# Patient Record
Sex: Female | Born: 1980 | Race: White | Hispanic: No | Marital: Married | Smoking: Never smoker
Health system: Southern US, Community
[De-identification: ages and names within clinical notes are randomized; demographics above are authoritative.]

## PROBLEM LIST (undated history)

## (undated) DIAGNOSIS — K900 Celiac disease: Secondary | ICD-10-CM

## (undated) DIAGNOSIS — R51 Headache: Secondary | ICD-10-CM

## (undated) DIAGNOSIS — Z8619 Personal history of other infectious and parasitic diseases: Secondary | ICD-10-CM

## (undated) DIAGNOSIS — J45909 Unspecified asthma, uncomplicated: Secondary | ICD-10-CM

## (undated) DIAGNOSIS — E039 Hypothyroidism, unspecified: Secondary | ICD-10-CM

## (undated) DIAGNOSIS — R519 Headache, unspecified: Secondary | ICD-10-CM

## (undated) DIAGNOSIS — F329 Major depressive disorder, single episode, unspecified: Secondary | ICD-10-CM

## (undated) DIAGNOSIS — F32A Depression, unspecified: Secondary | ICD-10-CM

## (undated) HISTORY — PX: TONSILLECTOMY: SUR1361

---

## 2014-12-25 LAB — OB RESULTS CONSOLE ABO/RH: RH Type: NEGATIVE

## 2014-12-25 LAB — OB RESULTS CONSOLE ANTIBODY SCREEN: ANTIBODY SCREEN: POSITIVE

## 2014-12-25 LAB — OB RESULTS CONSOLE RPR: RPR: NONREACTIVE

## 2014-12-25 LAB — OB RESULTS CONSOLE HIV ANTIBODY (ROUTINE TESTING): HIV: NONREACTIVE

## 2014-12-25 LAB — OB RESULTS CONSOLE RUBELLA ANTIBODY, IGM: RUBELLA: IMMUNE

## 2014-12-25 LAB — OB RESULTS CONSOLE HEPATITIS B SURFACE ANTIGEN: HEP B S AG: NEGATIVE

## 2015-07-05 ENCOUNTER — Encounter (HOSPITAL_COMMUNITY): Payer: Self-pay

## 2015-07-06 ENCOUNTER — Encounter (HOSPITAL_COMMUNITY): Payer: Self-pay

## 2015-07-06 ENCOUNTER — Telehealth (HOSPITAL_COMMUNITY): Payer: Self-pay | Admitting: *Deleted

## 2015-07-06 NOTE — Telephone Encounter (Signed)
Preadmission screen  

## 2015-07-06 NOTE — Patient Instructions (Signed)
20 Carolyn Knight  07/06/2015   Your procedure is scheduled on:  07/16/2015  Enter through the Main Entrance of [facility] at 1130 AM.  Pick up the phone at the desk and dial [phone].   Call this number if you have problems the morning of surgery: [phone]   Remember:   Do not eat food:After Midnight.  Do not drink clear liquids: 4 Hours before arrival.  Take these medicines the morning of surgery with A SIP OF WATER: synthroid   Do not wear jewelry, make-up or nail polish.  Do not wear lotions, powders, or perfumes. You may wear deodorant.  Do not shave 48 hours prior to surgery.  Do not bring valuables to the hospital.  [facility] is not   responsible for any belongings or valuables brought to the hospital.  Contacts, dentures or bridgework may not be worn into surgery.  Leave suitcase in the car. After surgery it may be brought to your room.  For patients admitted to the hospital, checkout time is 11:00 AM the day of              discharge.   Patients discharged the day of surgery will not be allowed to drive             home.  Name and phone number of your driver: na  Special Instructions:   Shower using CHG 2 nights before surgery and the night before surgery.  If you shower the day of surgery use CHG.  Use special wash - you have one bottle of CHG for all showers.  You should use approximately 1/3 of the bottle for each shower.   Please read over the following fact sheets that you were given:   Surgical Site Infection Prevention

## 2015-07-10 NOTE — H&P (Signed)
Carolyn SilversmithWendy Knight  DICTATION # [id number] CSN# [id number]   Carolyn PicaHOLLAND,Carolyn Knight M, MD 07/10/2015 12:00 PM

## 2015-07-11 ENCOUNTER — Inpatient Hospital Stay (HOSPITAL_COMMUNITY): Admission: RE | Admit: 2015-07-11 | Payer: BC Managed Care – PPO | Source: Ambulatory Visit

## 2015-07-11 NOTE — H&P (Signed)
NAMThea Knight:  Hottle, Kris              ACCOUNT NO.:  [id number]  MEDICAL RECORD NO.:  [id number]  LOCATION:                                 FACILITY:  PHYSICIAN:  Duke Salviaichard M. Marcelle OverlieHolland, M.D.DATE OF BIRTH:  Apr 25, 1980  DATE OF ADMISSION:  07/16/2015 DATE OF DISCHARGE:                             HISTORY & PHYSICAL   CHIEF COMPLAINT:  Breech presentation at term, fetal macrosomia, EFW is in 98th percentile for primary cesarean section.  HISTORY OF PRESENT ILLNESS:  A 35 year old, G3, P2-0-0-2, EDD July 23, 2015, presents at term for primary CS, declines ECV due to the EFW. This procedure including specific risks related to bleeding, infection, transfusion, wound infection, phlebitis, adjacent organ injury.  All reviewed with her which she understands and accepts.  GBS is negative. She is Rh negative.  Her thyroid profile has been normal during pregnancy, being checked because she has a history of being hypothyroid. One-hour GTT was normal at 116.  REVIEW OF SYSTEMS:  Significant otherwise for normal panorama screen and a history of celiac disease, 2 prior vaginal deliveries, [place].  For the remainder of her past medical history, please see the [facility] form.  PHYSICAL EXAMINATION:  VITAL SIGNS:  Temp 98.2, blood pressure 120/70. HEENT:  Unremarkable. NECK:  Supple without masses. LUNGS:  Clear. CARDIOVASCULAR:  Regular rate and rhythm without murmurs, rubs, or gallops. BREASTS:  Without masses. GU:  Term fundal height.  Fetal heart rate 140.  Cervix was closed, breech by Leopold's.  IMPRESSION:  Breech presentation at term, fetal macrosomia.  PLAN:  Primary cesarean section.  Procedure and risks discussed as above.     Gregrey Bloyd M. Marcelle OverlieHolland, M.D.   ______________________________ Duke Salviaichard M. Marcelle OverlieHolland, M.D.   RMH/MEDQ  D:  07/10/2015  T:  07/11/2015  Job:  [id number]

## 2015-07-13 ENCOUNTER — Encounter (HOSPITAL_COMMUNITY)
Admission: RE | Admit: 2015-07-13 | Discharge: 2015-07-13 | Disposition: A | Discharge status: Home / Self Care | Payer: BC Managed Care – PPO | Source: Ambulatory Visit | Attending: Obstetrics and Gynecology | Admitting: Obstetrics and Gynecology

## 2015-07-13 HISTORY — DX: Depression, unspecified: F32.A

## 2015-07-13 HISTORY — DX: Celiac disease: K90.0

## 2015-07-13 HISTORY — DX: Unspecified asthma, uncomplicated: J45.909

## 2015-07-13 HISTORY — DX: Hypothyroidism, unspecified: E03.9

## 2015-07-13 HISTORY — DX: Headache: R51

## 2015-07-13 HISTORY — DX: Headache, unspecified: R51.9

## 2015-07-13 HISTORY — DX: Major depressive disorder, single episode, unspecified: F32.9

## 2015-07-13 HISTORY — DX: Personal history of other infectious and parasitic diseases: Z86.19

## 2015-07-13 LAB — CBC
HEMATOCRIT: 36.9 % (ref 36.0–46.0)
HEMOGLOBIN: 12.3 g/dL (ref 12.0–15.0)
MCH: 29.9 pg (ref 26.0–34.0)
MCHC: 33.3 g/dL (ref 30.0–36.0)
MCV: 89.8 fL (ref 78.0–100.0)
Platelets: 334 10*3/uL (ref 150–400)
RBC: 4.11 MIL/uL (ref 3.87–5.11)
RDW: 14.5 % (ref 11.5–15.5)
WBC: 16.2 10*3/uL — ABNORMAL HIGH (ref 4.0–10.5)

## 2015-07-14 LAB — TYPE AND SCREEN
ABO/RH(D): O NEG
ANTIBODY SCREEN: POSITIVE
DAT, IGG: NEGATIVE
UNIT DIVISION: 0
UNIT DIVISION: 0

## 2015-07-14 LAB — RPR: RPR Ser Ql: NONREACTIVE

## 2015-07-15 MED ORDER — DEXTROSE 5 % IV SOLN
2.0000 g | INTRAVENOUS | Status: AC
  Administered 2015-07-16: 2 g via INTRAVENOUS
  Filled 2015-07-15: qty 2

## 2015-07-16 ENCOUNTER — Encounter (HOSPITAL_COMMUNITY)
Admission: AD | Disposition: A | Discharge status: Home / Self Care | Payer: Self-pay | Source: Ambulatory Visit | Attending: Obstetrics & Gynecology

## 2015-07-16 ENCOUNTER — Inpatient Hospital Stay (HOSPITAL_COMMUNITY): Payer: BC Managed Care – PPO | Admitting: Anesthesiology

## 2015-07-16 ENCOUNTER — Inpatient Hospital Stay (HOSPITAL_COMMUNITY)
Admission: AD | Admit: 2015-07-16 | Discharge: 2015-07-20 | DRG: 765 | Disposition: A | Discharge status: Home / Self Care | Payer: BC Managed Care – PPO | Source: Ambulatory Visit | Attending: Obstetrics & Gynecology | Admitting: Obstetrics & Gynecology

## 2015-07-16 ENCOUNTER — Encounter (HOSPITAL_COMMUNITY): Payer: Self-pay | Admitting: *Deleted

## 2015-07-16 DIAGNOSIS — Z3A39 39 weeks gestation of pregnancy: Secondary | ICD-10-CM

## 2015-07-16 DIAGNOSIS — O26893 Other specified pregnancy related conditions, third trimester: Secondary | ICD-10-CM | POA: Diagnosis present

## 2015-07-16 DIAGNOSIS — O3663X0 Maternal care for excessive fetal growth, third trimester, not applicable or unspecified: Secondary | ICD-10-CM | POA: Diagnosis present

## 2015-07-16 DIAGNOSIS — D649 Anemia, unspecified: Secondary | ICD-10-CM | POA: Diagnosis present

## 2015-07-16 DIAGNOSIS — Z6791 Unspecified blood type, Rh negative: Secondary | ICD-10-CM

## 2015-07-16 DIAGNOSIS — O9081 Anemia of the puerperium: Secondary | ICD-10-CM | POA: Diagnosis present

## 2015-07-16 DIAGNOSIS — Z98891 History of uterine scar from previous surgery: Secondary | ICD-10-CM

## 2015-07-16 DIAGNOSIS — O321XX0 Maternal care for breech presentation, not applicable or unspecified: Secondary | ICD-10-CM | POA: Diagnosis present

## 2015-07-16 DIAGNOSIS — D62 Acute posthemorrhagic anemia: Secondary | ICD-10-CM

## 2015-07-16 LAB — CBC
HCT: 26.4 % — ABNORMAL LOW (ref 36.0–46.0)
HEMOGLOBIN: 8.8 g/dL — AB (ref 12.0–15.0)
MCH: 30 pg (ref 26.0–34.0)
MCHC: 33.3 g/dL (ref 30.0–36.0)
MCV: 90.1 fL (ref 78.0–100.0)
Platelets: 307 10*3/uL (ref 150–400)
RBC: 2.93 MIL/uL — AB (ref 3.87–5.11)
RDW: 14.5 % (ref 11.5–15.5)
WBC: 24.9 10*3/uL — ABNORMAL HIGH (ref 4.0–10.5)

## 2015-07-16 LAB — BIRTH TISSUE RECOVERY COLLECTION (PLACENTA DONATION)

## 2015-07-16 SURGERY — Surgical Case
Anesthesia: Spinal

## 2015-07-16 MED ORDER — NALBUPHINE HCL 10 MG/ML IJ SOLN: 5.0000 mg | Freq: Once | INTRAMUSCULAR | Status: DC | PRN

## 2015-07-16 MED ORDER — MORPHINE SULFATE (PF) 0.5 MG/ML IJ SOLN
INTRAMUSCULAR | Status: AC
  Filled 2015-07-16: qty 10

## 2015-07-16 MED ORDER — PHENYLEPHRINE HCL 10 MG/ML IJ SOLN
INTRAMUSCULAR | Status: DC | PRN
  Administered 2015-07-16: 80 ug via INTRAVENOUS

## 2015-07-16 MED ORDER — LEVOTHYROXINE SODIUM 100 MCG PO TABS
100.0000 ug | ORAL_TABLET | Freq: Every day | ORAL | Status: DC
  Administered 2015-07-17 – 2015-07-20 (×4): 100 ug via ORAL
  Filled 2015-07-16 (×6): qty 1

## 2015-07-16 MED ORDER — COCONUT OIL OIL: 1.0000 "application " | TOPICAL_OIL | Status: DC | PRN

## 2015-07-16 MED ORDER — EPHEDRINE SULFATE 50 MG/ML IJ SOLN
INTRAMUSCULAR | Status: DC | PRN
  Administered 2015-07-16: 10 mg via INTRAVENOUS

## 2015-07-16 MED ORDER — DIBUCAINE 1 % RE OINT: 1.0000 "application " | TOPICAL_OINTMENT | RECTAL | Status: DC | PRN

## 2015-07-16 MED ORDER — KETOROLAC TROMETHAMINE 30 MG/ML IJ SOLN: 30.0000 mg | Freq: Four times a day (QID) | INTRAMUSCULAR | Status: DC | PRN

## 2015-07-16 MED ORDER — SODIUM CHLORIDE 0.9 % IV SOLN: 250.0000 mL | INTRAVENOUS | Status: DC

## 2015-07-16 MED ORDER — BISACODYL 10 MG RE SUPP: 10.0000 mg | Freq: Every day | RECTAL | Status: DC | PRN

## 2015-07-16 MED ORDER — ONDANSETRON HCL 4 MG/2ML IJ SOLN: 4.0000 mg | Freq: Three times a day (TID) | INTRAMUSCULAR | Status: DC | PRN

## 2015-07-16 MED ORDER — MENTHOL 3 MG MT LOZG: 1.0000 | LOZENGE | OROMUCOSAL | Status: DC | PRN

## 2015-07-16 MED ORDER — OXYTOCIN 10 UNIT/ML IJ SOLN
INTRAMUSCULAR | Status: AC
  Filled 2015-07-16: qty 4

## 2015-07-16 MED ORDER — DIPHENHYDRAMINE HCL 25 MG PO CAPS: 25.0000 mg | ORAL_CAPSULE | Freq: Four times a day (QID) | ORAL | Status: DC | PRN

## 2015-07-16 MED ORDER — EPHEDRINE 5 MG/ML INJ
INTRAVENOUS | Status: AC
  Filled 2015-07-16: qty 10

## 2015-07-16 MED ORDER — SIMETHICONE 80 MG PO CHEW
80.0000 mg | CHEWABLE_TABLET | ORAL | Status: DC
  Administered 2015-07-17 – 2015-07-19 (×4): 80 mg via ORAL
  Filled 2015-07-16 (×4): qty 1

## 2015-07-16 MED ORDER — OXYCODONE-ACETAMINOPHEN 5-325 MG PO TABS
1.0000 | ORAL_TABLET | ORAL | Status: DC | PRN
  Administered 2015-07-17 (×2): 1 via ORAL
  Filled 2015-07-16 (×2): qty 1

## 2015-07-16 MED ORDER — PHENYLEPHRINE 8 MG IN D5W 100 ML (0.08MG/ML) PREMIX OPTIME
INJECTION | INTRAVENOUS | Status: AC
  Filled 2015-07-16: qty 100

## 2015-07-16 MED ORDER — PRENATAL MULTIVITAMIN CH
1.0000 | ORAL_TABLET | Freq: Every day | ORAL | Status: DC
  Administered 2015-07-17 – 2015-07-20 (×4): 1 via ORAL
  Filled 2015-07-16 (×4): qty 1

## 2015-07-16 MED ORDER — FLEET ENEMA 7-19 GM/118ML RE ENEM: 1.0000 | ENEMA | Freq: Every day | RECTAL | Status: DC | PRN

## 2015-07-16 MED ORDER — KETOROLAC TROMETHAMINE 30 MG/ML IJ SOLN
INTRAMUSCULAR | Status: AC
  Filled 2015-07-16: qty 1

## 2015-07-16 MED ORDER — IBUPROFEN 800 MG PO TABS
800.0000 mg | ORAL_TABLET | Freq: Three times a day (TID) | ORAL | Status: DC | PRN
  Administered 2015-07-17 – 2015-07-20 (×10): 800 mg via ORAL
  Filled 2015-07-16 (×10): qty 1

## 2015-07-16 MED ORDER — SIMETHICONE 80 MG PO CHEW: 80.0000 mg | CHEWABLE_TABLET | ORAL | Status: DC | PRN

## 2015-07-16 MED ORDER — SODIUM CHLORIDE 0.9% FLUSH
3.0000 mL | Freq: Two times a day (BID) | INTRAVENOUS | Status: DC
  Administered 2015-07-19 (×2): 3 mL via INTRAVENOUS

## 2015-07-16 MED ORDER — NALBUPHINE HCL 10 MG/ML IJ SOLN: 5.0000 mg | INTRAMUSCULAR | Status: DC | PRN

## 2015-07-16 MED ORDER — LACTATED RINGERS IV SOLN
INTRAVENOUS | Status: DC
  Administered 2015-07-16 (×2): via INTRAVENOUS

## 2015-07-16 MED ORDER — KETOROLAC TROMETHAMINE 30 MG/ML IJ SOLN
30.0000 mg | Freq: Four times a day (QID) | INTRAMUSCULAR | Status: DC | PRN
  Administered 2015-07-16: 30 mg via INTRAMUSCULAR

## 2015-07-16 MED ORDER — MEASLES, MUMPS & RUBELLA VAC ~~LOC~~ INJ: 0.5000 mL | INJECTION | Freq: Once | SUBCUTANEOUS | Status: DC

## 2015-07-16 MED ORDER — SCOPOLAMINE 1 MG/3DAYS TD PT72: 1.0000 | MEDICATED_PATCH | Freq: Once | TRANSDERMAL | Status: DC

## 2015-07-16 MED ORDER — MEPERIDINE HCL 25 MG/ML IJ SOLN: 6.2500 mg | INTRAMUSCULAR | Status: DC | PRN

## 2015-07-16 MED ORDER — BUPIVACAINE IN DEXTROSE 0.75-8.25 % IT SOLN
INTRATHECAL | Status: DC | PRN
  Administered 2015-07-16: 1.6 mL via INTRATHECAL

## 2015-07-16 MED ORDER — PHENYLEPHRINE 8 MG IN D5W 100 ML (0.08MG/ML) PREMIX OPTIME
INJECTION | INTRAVENOUS | Status: DC | PRN
  Administered 2015-07-16: 60 ug/min via INTRAVENOUS

## 2015-07-16 MED ORDER — SENNOSIDES-DOCUSATE SODIUM 8.6-50 MG PO TABS
2.0000 | ORAL_TABLET | ORAL | Status: DC
  Administered 2015-07-17 – 2015-07-19 (×4): 2 via ORAL
  Filled 2015-07-16 (×4): qty 2

## 2015-07-16 MED ORDER — NALOXONE HCL 0.4 MG/ML IJ SOLN: 0.4000 mg | INTRAMUSCULAR | Status: DC | PRN

## 2015-07-16 MED ORDER — SIMETHICONE 80 MG PO CHEW
80.0000 mg | CHEWABLE_TABLET | Freq: Three times a day (TID) | ORAL | Status: DC
  Administered 2015-07-16 – 2015-07-20 (×10): 80 mg via ORAL
  Filled 2015-07-16 (×11): qty 1

## 2015-07-16 MED ORDER — DIPHENHYDRAMINE HCL 50 MG/ML IJ SOLN: 12.5000 mg | INTRAMUSCULAR | Status: DC | PRN

## 2015-07-16 MED ORDER — LACTATED RINGERS IV SOLN: INTRAVENOUS | Status: AC

## 2015-07-16 MED ORDER — WITCH HAZEL-GLYCERIN EX PADS: 1.0000 "application " | MEDICATED_PAD | CUTANEOUS | Status: DC | PRN

## 2015-07-16 MED ORDER — SODIUM CHLORIDE 0.9% FLUSH: 3.0000 mL | INTRAVENOUS | Status: DC | PRN

## 2015-07-16 MED ORDER — LACTATED RINGERS IV SOLN
INTRAVENOUS | Status: DC | PRN
  Administered 2015-07-16: 13:00:00 via INTRAVENOUS

## 2015-07-16 MED ORDER — SCOPOLAMINE 1 MG/3DAYS TD PT72
MEDICATED_PATCH | TRANSDERMAL | Status: AC
  Administered 2015-07-16: 1.5 mg via TRANSDERMAL
  Filled 2015-07-16: qty 1

## 2015-07-16 MED ORDER — ONDANSETRON HCL 4 MG/2ML IJ SOLN
INTRAMUSCULAR | Status: AC
  Filled 2015-07-16: qty 2

## 2015-07-16 MED ORDER — FENTANYL CITRATE (PF) 100 MCG/2ML IJ SOLN
INTRAMUSCULAR | Status: AC
  Filled 2015-07-16: qty 2

## 2015-07-16 MED ORDER — ONDANSETRON HCL 4 MG/2ML IJ SOLN
INTRAMUSCULAR | Status: DC | PRN
  Administered 2015-07-16: 4 mg via INTRAVENOUS

## 2015-07-16 MED ORDER — ZOLPIDEM TARTRATE 5 MG PO TABS: 5.0000 mg | ORAL_TABLET | Freq: Every evening | ORAL | Status: DC | PRN

## 2015-07-16 MED ORDER — OXYTOCIN 40 UNITS IN LACTATED RINGERS INFUSION - SIMPLE MED: 2.5000 [IU]/h | INTRAVENOUS | Status: AC

## 2015-07-16 MED ORDER — FENTANYL CITRATE (PF) 100 MCG/2ML IJ SOLN: 25.0000 ug | INTRAMUSCULAR | Status: DC | PRN

## 2015-07-16 MED ORDER — LACTATED RINGERS IV BOLUS (SEPSIS)
300.0000 mL | Freq: Once | INTRAVENOUS | Status: AC
  Administered 2015-07-16: 300 mL via INTRAVENOUS

## 2015-07-16 MED ORDER — DIPHENHYDRAMINE HCL 25 MG PO CAPS: 25.0000 mg | ORAL_CAPSULE | ORAL | Status: DC | PRN

## 2015-07-16 MED ORDER — OXYTOCIN 10 UNIT/ML IJ SOLN
40.0000 [IU] | INTRAVENOUS | Status: DC | PRN
  Administered 2015-07-16: 40 [IU] via INTRAVENOUS

## 2015-07-16 MED ORDER — NALOXONE HCL 2 MG/2ML IJ SOSY
1.0000 ug/kg/h | PREFILLED_SYRINGE | INTRAVENOUS | Status: DC | PRN
  Filled 2015-07-16: qty 2

## 2015-07-16 MED ORDER — FENTANYL CITRATE (PF) 100 MCG/2ML IJ SOLN
INTRAMUSCULAR | Status: DC | PRN
  Administered 2015-07-16: 20 ug via INTRATHECAL

## 2015-07-16 MED ORDER — MORPHINE SULFATE (PF) 0.5 MG/ML IJ SOLN
INTRAMUSCULAR | Status: DC | PRN
  Administered 2015-07-16: .2 mg via INTRATHECAL

## 2015-07-16 MED ORDER — SCOPOLAMINE 1 MG/3DAYS TD PT72
1.0000 | MEDICATED_PATCH | Freq: Once | TRANSDERMAL | Status: DC
  Administered 2015-07-16: 1.5 mg via TRANSDERMAL

## 2015-07-16 MED ORDER — ALBUTEROL SULFATE (2.5 MG/3ML) 0.083% IN NEBU: 3.0000 mL | INHALATION_SOLUTION | Freq: Four times a day (QID) | RESPIRATORY_TRACT | Status: DC | PRN

## 2015-07-16 MED ORDER — LACTATED RINGERS IV SOLN
Freq: Once | INTRAVENOUS | Status: AC
  Administered 2015-07-16: 12:00:00 via INTRAVENOUS

## 2015-07-16 MED ORDER — OXYCODONE-ACETAMINOPHEN 5-325 MG PO TABS
2.0000 | ORAL_TABLET | ORAL | Status: DC | PRN
Start: 2015-07-16 — End: 2015-07-20
  Administered 2015-07-17 – 2015-07-20 (×12): 2 via ORAL
  Filled 2015-07-16 (×12): qty 2

## 2015-07-16 MED ORDER — TETANUS-DIPHTH-ACELL PERTUSSIS 5-2.5-18.5 LF-MCG/0.5 IM SUSP: 0.5000 mL | Freq: Once | INTRAMUSCULAR | Status: DC

## 2015-07-16 MED ORDER — ACETAMINOPHEN 325 MG PO TABS
650.0000 mg | ORAL_TABLET | ORAL | Status: DC | PRN
Start: 2015-07-16 — End: 2015-07-20

## 2015-07-16 SURGICAL SUPPLY — 27 items
BENZOIN TINCTURE PRP APPL 2/3 (GAUZE/BANDAGES/DRESSINGS) ×2 IMPLANT
CLAMP CORD UMBIL (MISCELLANEOUS) IMPLANT
CLOSURE STERI STRIP 1/2 X4 (GAUZE/BANDAGES/DRESSINGS) ×2 IMPLANT
CLOTH BEACON ORANGE TIMEOUT ST (SAFETY) ×2 IMPLANT
DRSG OPSITE POSTOP 4X10 (GAUZE/BANDAGES/DRESSINGS) ×2 IMPLANT
DURAPREP 26ML APPLICATOR (WOUND CARE) ×2 IMPLANT
ELECT REM PT RETURN 9FT ADLT (ELECTROSURGICAL) ×2
ELECTRODE REM PT RTRN 9FT ADLT (ELECTROSURGICAL) ×1 IMPLANT
EXTRACTOR VACUUM M CUP 4 TUBE (SUCTIONS) IMPLANT
GLOVE BIO SURGEON STRL SZ7 (GLOVE) ×2 IMPLANT
GLOVE BIOGEL PI IND STRL 7.0 (GLOVE) ×2 IMPLANT
GLOVE BIOGEL PI INDICATOR 7.0 (GLOVE) ×2
GOWN STRL REUS W/TWL LRG LVL3 (GOWN DISPOSABLE) ×4 IMPLANT
KIT ABG SYR 3ML LUER SLIP (SYRINGE) IMPLANT
NEEDLE HYPO 25X5/8 SAFETYGLIDE (NEEDLE) ×2 IMPLANT
NS IRRIG 1000ML POUR BTL (IV SOLUTION) ×2 IMPLANT
PACK C SECTION WH (CUSTOM PROCEDURE TRAY) ×2 IMPLANT
PAD OB MATERNITY 4.3X12.25 (PERSONAL CARE ITEMS) ×2 IMPLANT
PENCIL SMOKE EVAC W/HOLSTER (ELECTROSURGICAL) ×2 IMPLANT
STRIP CLOSURE SKIN 1/2X4 (GAUZE/BANDAGES/DRESSINGS) ×2 IMPLANT
SUT CHROMIC 0 CTX 36 (SUTURE) ×6 IMPLANT
SUT MON AB 4-0 PS1 27 (SUTURE) ×2 IMPLANT
SUT PDS AB 0 CT1 27 (SUTURE) ×4 IMPLANT
SUT VIC AB 3-0 CT1 27 (SUTURE) ×2
SUT VIC AB 3-0 CT1 TAPERPNT 27 (SUTURE) ×2 IMPLANT
TOWEL OR 17X24 6PK STRL BLUE (TOWEL DISPOSABLE) ×2 IMPLANT
TRAY FOLEY CATH SILVER 14FR (SET/KITS/TRAYS/PACK) ×2 IMPLANT

## 2015-07-16 NOTE — Addendum Note (Signed)
Addendum  created 07/16/15 1605 by Elgie CongoNataliya H Ritik Stavola, CRNA   Modules edited: Clinical Notes   Clinical Notes:  File: [id number]

## 2015-07-16 NOTE — Transfer of Care (Signed)
Immediate Anesthesia Transfer of Care Note  Patient: Carolyn Knight  Procedure(s) Performed: Procedure(s) with comments: CESAREAN SECTION (N/A) - Primary edc 07/23/15 NKDA   Patient Location: PACU  Anesthesia Type:Spinal  Level of Consciousness: awake  Airway & Oxygen Therapy: Patient Spontanous Breathing  Post-op Assessment: Report given to RN and Post -op Vital signs reviewed and stable  Post vital signs: stable  Last Vitals:  Filed Vitals:   07/16/15 1142  BP: 136/80  Pulse: 86  Temp: 36.7 C  Resp: 16    Last Pain: There were no vitals filed for this visit.    Patients Stated Pain Goal: 5 (07/16/15 1142)  Complications: No apparent anesthesia complications

## 2015-07-16 NOTE — Anesthesia Procedure Notes (Signed)
Spinal Patient location during procedure: OB Staffing Anesthesiologist: Chery Giusto Preanesthetic Checklist Completed: patient identified, surgical consent, pre-op evaluation, timeout performed, IV checked, risks and benefits discussed and monitors and equipment checked Spinal Block Patient position: sitting Prep: site prepped and draped and DuraPrep Patient monitoring: heart rate, cardiac monitor, continuous pulse ox and blood pressure Approach: midline Location: L3-4 Injection technique: single-shot Needle Needle type: Pencan  Needle gauge: 24 G Needle length: 10 cm Assessment Sensory level: T4   

## 2015-07-16 NOTE — Progress Notes (Signed)
Notified Dr Marcelle Overlieholland of patients urine output, see new orders.

## 2015-07-16 NOTE — Anesthesia Postprocedure Evaluation (Signed)
Anesthesia Post Note  Patient: Carolyn SilversmithWendy Knight  Procedure(s) Performed: Procedure(s) (LRB): CESAREAN SECTION (N/A)  Patient location during evaluation: Mother Baby Anesthesia Type: Spinal Level of consciousness: oriented and awake and alert Pain management: pain level controlled Vital Signs Assessment: post-procedure vital signs reviewed and stable Respiratory status: nonlabored ventilation and spontaneous breathing Cardiovascular status: stable Postop Assessment: spinal receding, patient able to bend at knees and adequate PO intake Anesthetic complications: no     Last Vitals:  Filed Vitals:   07/16/15 1500 07/16/15 1509  BP:  108/56  Pulse: 59 61  Temp:  36.8 C  Resp: 13 18    Last Pain: There were no vitals filed for this visit. Pain Goal: Patients Stated Pain Goal: 0 (07/16/15 1445)               Donnalee CurryMalinova,Emelda Kohlbeck Hristova

## 2015-07-16 NOTE — Lactation Note (Signed)
This note was copied from a baby's chart. Lactation Consultation Note Initial visit at 9 hours of age.  Mom is recovering from c/s and Rn reports mom having difficulty getting out of bed and feeling faint.  Mom reports frequent feedings and denies pain with latch.  Mom is experience with 2 older children breastfeeding well.  Baby is 10#6oz and difficult to position post c/s.  Lc assisted with modified cradle hold.  Baby has deep wide latch and mom has large soft breast requiring mom to compress for air space as baby is nuzzled deeply into breast.  Lc assisted with repositioning for better angle and encouraged mom to compress breast to keep baby active during feedings.  [facility] resources given and discussed.  Encouraged to feed with early cues on demand.  Early newborn behavior discussed.  Hand expression demonstrated with colostrum visible.  Mom to call for assist as needed.      Patient Name: Carolyn Thea SilversmithWendy Nosal WUJWJ'XToday's Date: 07/16/2015 Reason for consult: Initial assessment   Maternal Data Has patient been taught Hand Expression?: Yes Does the patient have breastfeeding experience prior to this delivery?: Yes  Feeding Feeding Type: Breast Fed Length of feed:  (several minutes observed)  LATCH Score/Interventions Latch: Grasps breast easily, tongue down, lips flanged, rhythmical sucking.  Audible Swallowing: A few with stimulation  Type of Nipple: Everted at rest and after stimulation  Comfort (Breast/Nipple): Soft / non-tender     Hold (Positioning): Assistance needed to correctly position infant at breast and maintain latch. Intervention(s): Breastfeeding basics reviewed;Support Pillows;Position options;Skin to skin  LATCH Score: 8  Lactation Tools Discussed/Used WIC Program: No   Consult Status Consult Status: Follow-up Date: 07/17/15 Follow-up type: In-patient    Alece Koppel, Arvella MerlesJana Lynn 07/16/2015, 10:40 PM

## 2015-07-16 NOTE — Progress Notes (Signed)
Dr. Marcelle OverlieHolland notified that no type and screen and been done this afternoon before patients C-Section. Did not want another one at this time. Pt bleeding scan, firm and midline U/1.

## 2015-07-16 NOTE — Progress Notes (Signed)
The patient was re-examined with no change in status 

## 2015-07-16 NOTE — Op Note (Signed)
Preoperative diagnosis: Fetal macrosomia at term  Postoperative diagnosis: Same  Procedure: Primary low transverse cesarean section  Surgeon: Marcelle OverlieHolland  Anesthesia: Spinal  EBL: 700 cc  Procedure and findings:  Patient taken the operating room after an adequate level of spinal anesthetic was obtained with the patient left tilt position the abdomen prepped and draped and a Foley catheter position. Appropriate timeouts taken at that point.  Pfannenstiel incision was made 2 finger breaths above the symphysis, carried down to the fascia which was incised and extended transversely. Rectus muscle divided in the midline, peritoneum entered superiorly without incident and extended in a vertical fashion. The vesicouterine serosa was incised, and the bladder was bluntly and sharply dissected below, bladder blade repositioned at that point. Transverse incision made in the lower segment extended with bandage scissors, clear fluid noted the patient then delivered of a 10+ pound infant, the infant was suctioned cord clamped and passed the pediatric team for further care., Placenta was then delivered manually intact, uterus exteriorized, cavity wiped clean with laparotomy pack, closure obtained the first layer of 0 chromic in a locked fashion followed by number K layer of 0 chromic. This was hemostatic bilateral tubes and ovaries were normal. Prior to closure sponge, needle, history counts reported as correct 2. Peritoneum closed with a 3-0 Vicryl running suture, the same for the rectus muscles in the midline. 0 PDS was then used to close the fascia transversely septated tissue was irrigated noted be hemostatic was not closed separately. 4-0 Monocryl subarticular closure with Steri-Strips. She tolerated this well went to recovery room in good condition.  Dictated with [facility]  Huey Scalia Milana ObeyM Ashlynd Michna M.D.

## 2015-07-16 NOTE — Progress Notes (Signed)
Called Dr Marcelle Overlieholland to clarify code status, see new verbal order.

## 2015-07-16 NOTE — Anesthesia Preprocedure Evaluation (Signed)
Anesthesia Evaluation  Patient identified by MRN, date of birth, ID band Patient awake    Reviewed: Allergy & Precautions, NPO status , Patient's Chart, lab work & pertinent test results  History of Anesthesia Complications Negative for: history of anesthetic complications  Airway Mallampati: II  TM Distance: >3 FB Neck ROM: Full    Dental  (+) Teeth Intact   Pulmonary asthma ,    breath sounds clear to auscultation       Cardiovascular negative cardio ROS   Rhythm:Regular     Neuro/Psych  Headaches, neg Seizures PSYCHIATRIC DISORDERS Depression    GI/Hepatic negative GI ROS, Neg liver ROS,   Endo/Other  Hypothyroidism   Renal/GU      Musculoskeletal   Abdominal   Peds  Hematology negative hematology ROS (+)   Anesthesia Other Findings   Reproductive/Obstetrics (+) Pregnancy                             Anesthesia Physical Anesthesia Plan  ASA: II  Anesthesia Plan: Spinal   Post-op Pain Management:    Induction:   Airway Management Planned: Natural Airway, Nasal Cannula and Simple Face Mask  Additional Equipment: None  Intra-op Plan:   Post-operative Plan:   Informed Consent: I have reviewed the patients History and Physical, chart, labs and discussed the procedure including the risks, benefits and alternatives for the proposed anesthesia with the patient or authorized representative who has indicated his/her understanding and acceptance.   Dental advisory given  Plan Discussed with: CRNA and Surgeon  Anesthesia Plan Comments:         Anesthesia Quick Evaluation

## 2015-07-16 NOTE — Progress Notes (Signed)
Attempted to ambulate pt. When sitting on edge of bed pt stated she felt dizzy. BP 79/36. Pt back to bed safely. BP after lying down went up to 88/48. Dr. Marcelle OverlieHolland notified of events. Ordered STAT CBC & to call back with results and a LR bolus of 300 mL. RN remained in room with patient for 30 minutes. Alert and oriented. Pt stated she felt better. Will continue to monitor

## 2015-07-16 NOTE — Anesthesia Postprocedure Evaluation (Signed)
Anesthesia Post Note  Patient: Carolyn SilversmithWendy Flemister  Procedure(s) Performed: Procedure(s) (LRB): CESAREAN SECTION (N/A)  Patient location during evaluation: PACU Anesthesia Type: Spinal Level of consciousness: awake Pain management: pain level controlled Vital Signs Assessment: post-procedure vital signs reviewed and stable Respiratory status: spontaneous breathing Cardiovascular status: stable Postop Assessment: no signs of nausea or vomiting, patient able to bend at knees and spinal receding Anesthetic complications: no     Last Vitals:  Filed Vitals:   07/16/15 1500 07/16/15 1509  BP:  108/56  Pulse: 59 61  Temp:  36.8 C  Resp: 13 18    Last Pain: There were no vitals filed for this visit. Pain Goal: Patients Stated Pain Goal: 0 (07/16/15 1445)               Andra Heslin

## 2015-07-17 LAB — CBC
HCT: 22.2 % — ABNORMAL LOW (ref 36.0–46.0)
HCT: 20.7 % — ABNORMAL LOW (ref 36.0–46.0)
HEMOGLOBIN: 7.5 g/dL — AB (ref 12.0–15.0)
HEMOGLOBIN: 7 g/dL — AB (ref 12.0–15.0)
MCH: 30.2 pg (ref 26.0–34.0)
MCH: 30.4 pg (ref 26.0–34.0)
MCHC: 33.8 g/dL (ref 30.0–36.0)
MCHC: 33.8 g/dL (ref 30.0–36.0)
MCV: 89.5 fL (ref 78.0–100.0)
MCV: 90 fL (ref 78.0–100.0)
PLATELETS: 302 10*3/uL (ref 150–400)
Platelets: 293 10*3/uL (ref 150–400)
RBC: 2.48 MIL/uL — ABNORMAL LOW (ref 3.87–5.11)
RBC: 2.3 MIL/uL — AB (ref 3.87–5.11)
RDW: 14.6 % (ref 11.5–15.5)
RDW: 14.7 % (ref 11.5–15.5)
WBC: 26.2 10*3/uL — AB (ref 4.0–10.5)
WBC: 26 10*3/uL — AB (ref 4.0–10.5)

## 2015-07-17 MED ORDER — FERROUS SULFATE 325 (65 FE) MG PO TABS
325.0000 mg | ORAL_TABLET | Freq: Three times a day (TID) | ORAL | Status: DC
  Administered 2015-07-17 – 2015-07-20 (×10): 325 mg via ORAL
  Filled 2015-07-17 (×10): qty 1

## 2015-07-17 NOTE — Progress Notes (Signed)
Subjective: Postpartum Day 1: Cesarean Delivery Patient reports tolerating PO.   Has not been up this am, no c/o SOB Objective: Vital signs in last 24 hours: Temp:  [97.7 F (36.5 C)-98.5 F (36.9 C)] 98.5 F (36.9 C) (06/27 0548) Pulse Rate:  [58-86] 84 (06/27 0548) Resp:  [13-30] 18 (06/27 0548) BP: (88-136)/(47-80) 101/57 mmHg (06/27 0548) SpO2:  [95 %-100 %] 99 % (06/27 0548)  Physical Exam:  General: alert and cooperative Lochia: appropriate Uterine Fundus: firm Incision: healing well DVT Evaluation: No evidence of DVT seen on physical exam. Negative Homan's sign. No cords or calf tenderness.   Recent Labs  07/16/15 2155 07/17/15 0503  HGB 8.8* 7.5*  HCT 26.4* 22.2*    Assessment/Plan: Status post Cesarean section. Postoperative course complicated by anemia  Feso4 bid with meals  cbc @ 1pm.  Nakai Pollio G 07/17/2015, 7:46 AM

## 2015-07-17 NOTE — Progress Notes (Signed)
Attempted to ambulate patient, changed positions slowly. Sat at the side of bed and felt dizzy, but felt she was ok. Stood at the bedside starting getting blurry vision and ears ringing, then sat back down. Patient requested to stand again, had blurry vision but no ringing in ears. Patient stood and brushes teeth at the sink next to the bed, and Rn performed peri care.

## 2015-07-17 NOTE — Progress Notes (Signed)
CTSP: regarding anemia Patient was dizzy this am. As day progressed is able to stand but is dizzy and has ringing in ears initially.  Filed Vitals:   07/17/15 1312 07/17/15 1811  BP: 125/59 114/55  Pulse: 77 81  Temp: 98.9 F (37.2 C) 97.7 F (36.5 C)  Resp: 18 21   Results for orders placed or performed during the hospital encounter of 07/16/15 (from the past 24 hour(s))  Type and screen [facility]     Status: None (Preliminary result)   Collection Time: 07/16/15  9:55 PM  Result Value Ref Range   ABO/RH(D) O NEG    Antibody Screen POS    Sample Expiration 07/19/2015    Antibody Identification NO CLINICALLY SIGNIFICANT ANTIBODY IDENTIFIED    DAT, IgG NEG    Unit Number [id number]    Blood Component Type RBC LR PHER2    Unit division 00    Status of Unit ALLOCATED    Transfusion Status OK TO TRANSFUSE    Crossmatch Result COMPATIBLE    Unit Number [id number]    Blood Component Type RBC LR PHER2    Unit division 00    Status of Unit ALLOCATED    Transfusion Status OK TO TRANSFUSE    Crossmatch Result COMPATIBLE   CBC     Status: Abnormal   Collection Time: 07/16/15  9:55 PM  Result Value Ref Range   WBC 24.9 (H) 4.0 - 10.5 K/uL   RBC 2.93 (L) 3.87 - 5.11 MIL/uL   Hemoglobin 8.8 (L) 12.0 - 15.0 g/dL   HCT 44.026.4 (L) 34.736.0 - 42.546.0 %   MCV 90.1 78.0 - 100.0 fL   MCH 30.0 26.0 - 34.0 pg   MCHC 33.3 30.0 - 36.0 g/dL   RDW 95.614.5 38.711.5 - 56.415.5 %   Platelets 307 150 - 400 K/uL  Collect bld for placenta donatation     Status: None   Collection Time: 07/16/15  9:58 PM  Result Value Ref Range   Placenta donation bld collect COLLECTED BY LABORATORY   CBC     Status: Abnormal   Collection Time: 07/17/15  5:03 AM  Result Value Ref Range   WBC 26.2 (H) 4.0 - 10.5 K/uL   RBC 2.48 (L) 3.87 - 5.11 MIL/uL   Hemoglobin 7.5 (L) 12.0 - 15.0 g/dL   HCT 33.222.2 (L) 95.136.0 - 88.446.0 %   MCV 89.5 78.0 - 100.0 fL   MCH 30.2 26.0 - 34.0 pg   MCHC 33.8 30.0 - 36.0 g/dL   RDW  16.614.6 06.311.5 - 01.615.5 %   Platelets 293 150 - 400 K/uL  CBC     Status: Abnormal   Collection Time: 07/17/15 12:53 PM  Result Value Ref Range   WBC 26.0 (H) 4.0 - 10.5 K/uL   RBC 2.30 (L) 3.87 - 5.11 MIL/uL   Hemoglobin 7.0 (L) 12.0 - 15.0 g/dL   HCT 01.020.7 (L) 93.236.0 - 35.546.0 %   MCV 90.0 78.0 - 100.0 fL   MCH 30.4 26.0 - 34.0 pg   MCHC 33.8 30.0 - 36.0 g/dL   RDW 73.214.7 20.211.5 - 54.215.5 %   Platelets 302 150 - 400 K/uL    A/P: Symptomatic anemia         D/W patient above-her persistent symptoms, low Hgb, narrow safety margin should a PP bleeding episode occur, risk of falling and injuring herself and/or baby. D/W transfusion of 2 Units of PRBCs. Reviewed risks including infection HIV/Hep and transfusion reaction.  She will consider. Check CBC in am.

## 2015-07-17 NOTE — Progress Notes (Signed)
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  [name] was engaged during meeting, and appeared to be interested with meeting with CSW.  [name] introduced her room guest as FOB Gregary Signs(Sean Parshely), and her older 2 daughters. [name] gave CSW permission to meet with [name] with FOB being present.  [name] acknowledged that she experienced PPD with her first daughter (09/08/2008). However, did not experience PPD with her middle child.  CSW educated [name] about perinatal mood disorder and offered [name] resources and referrals.  [name] declined resources and referrals and communicated that she will contact her OBGYN or primary care physician if warranted. CSW also encouraged [name] to seek medical attention if needed for increased signs and symptoms for PPD.  CSW reviewed safe sleep and SIDS. [name] was knowledgeable, and was able to communicate interventions to reduce chances of SIDS.CSW thanked [name] for meeting with CSW.  [name] did not have any questions or concerns during this time.

## 2015-07-17 NOTE — Lactation Note (Addendum)
This note was copied from a baby's chart. Lactation Consultation Note  Patient Name: Carolyn Knight ZOXWR'UToday's Date: 07/17/2015 Reason for consult: Follow-up assessment  Baby is 5626 hours old and has been to the breast consistently , 2% weight loss. Voids and stools for age.  LC observed baby latching in and off pattern . LC assisted with depth and showing mom how to  Enhance baby to open wide and mold the tissue in the baby's mouth,  LC reviewed hand expressing, steady flow noted.  Baby fed 10 mins before [facility] came in [facility] 8 [facility].  @ wet diapers changed by LC .  Moms Hgb - 7.0 from 12.0. LC encouraged mom to drink plenty and increased calories.  Steady flow when hand expressing - will reassess extra pumping due to low hgb tomorrow.     Maternal Data Has patient been taught Hand Expression?: Yes  Feeding Feeding Type: Breast Fed Length of feed: 10 min (multiply swallows noted )  LATCH Score/Interventions Latch: Repeated attempts needed to sustain latch, nipple held in mouth throughout feeding, stimulation needed to elicit sucking reflex.  Audible Swallowing: Spontaneous and intermittent  Type of Nipple: Everted at rest and after stimulation  Comfort (Breast/Nipple): Soft / non-tender     Hold (Positioning): Assistance needed to correctly position infant at breast and maintain latch.  LATCH Score: 8  Lactation Tools Discussed/Used     Consult Status      Kathrin Greathouseorio, Sherilyn Windhorst Ann 07/17/2015, 3:54 PM

## 2015-07-18 ENCOUNTER — Inpatient Hospital Stay (HOSPITAL_COMMUNITY): Payer: BC Managed Care – PPO

## 2015-07-18 DIAGNOSIS — Z98891 History of uterine scar from previous surgery: Secondary | ICD-10-CM

## 2015-07-18 LAB — CBC
HCT: 17.5 % — ABNORMAL LOW (ref 36.0–46.0)
HCT: 23.2 % — ABNORMAL LOW (ref 36.0–46.0)
HEMOGLOBIN: 7.8 g/dL — AB (ref 12.0–15.0)
Hemoglobin: 5.9 g/dL — CL (ref 12.0–15.0)
MCH: 30.6 pg (ref 26.0–34.0)
MCH: 31.2 pg (ref 26.0–34.0)
MCHC: 33.7 g/dL (ref 30.0–36.0)
MCHC: 33.6 g/dL (ref 30.0–36.0)
MCV: 90.7 fL (ref 78.0–100.0)
MCV: 92.8 fL (ref 78.0–100.0)
PLATELETS: 286 10*3/uL (ref 150–400)
PLATELETS: 316 10*3/uL (ref 150–400)
RBC: 1.93 MIL/uL — AB (ref 3.87–5.11)
RBC: 2.5 MIL/uL — AB (ref 3.87–5.11)
RDW: 15.4 % (ref 11.5–15.5)
RDW: 15.1 % (ref 11.5–15.5)
WBC: 25.8 10*3/uL — AB (ref 4.0–10.5)
WBC: 25.2 10*3/uL — AB (ref 4.0–10.5)

## 2015-07-18 LAB — PREPARE RBC (CROSSMATCH)

## 2015-07-18 MED ORDER — SODIUM CHLORIDE 0.9 % IV SOLN
Freq: Once | INTRAVENOUS | Status: AC
  Administered 2015-07-18: 08:00:00 via INTRAVENOUS

## 2015-07-18 NOTE — Progress Notes (Signed)
Tired + flatus, voiding  Filed Vitals:   07/17/15 1811 07/18/15 0600  BP: 114/55 104/50  Pulse: 81 77  Temp: 97.7 F (36.5 C) 97.9 F (36.6 C)  Resp: 21 18   Abdomen soft, non distended  Results for orders placed or performed during the hospital encounter of 07/16/15 (from the past 24 hour(s))  CBC     Status: Abnormal   Collection Time: 07/17/15 12:53 PM  Result Value Ref Range   WBC 26.0 (H) 4.0 - 10.5 K/uL   RBC 2.30 (L) 3.87 - 5.11 MIL/uL   Hemoglobin 7.0 (L) 12.0 - 15.0 g/dL   HCT 84.620.7 (L) 96.236.0 - 95.246.0 %   MCV 90.0 78.0 - 100.0 fL   MCH 30.4 26.0 - 34.0 pg   MCHC 33.8 30.0 - 36.0 g/dL   RDW 84.114.7 32.411.5 - 40.115.5 %   Platelets 302 150 - 400 K/uL  CBC     Status: Abnormal   Collection Time: 07/18/15  5:29 AM  Result Value Ref Range   WBC 25.8 (H) 4.0 - 10.5 K/uL   RBC 1.93 (L) 3.87 - 5.11 MIL/uL   Hemoglobin 5.9 (LL) 12.0 - 15.0 g/dL   HCT 02.717.5 (L) 25.336.0 - 66.446.0 %   MCV 90.7 78.0 - 100.0 fL   MCH 30.6 26.0 - 34.0 pg   MCHC 33.7 30.0 - 36.0 g/dL   RDW 40.315.4 47.411.5 - 25.915.5 %   Platelets 286 150 - 400 K/uL   D/W patient anemia  A/P: anemia-recommend transfusion 2 units PRBCs-reviewed with patient risks         She states she understands and agrees

## 2015-07-18 NOTE — Progress Notes (Signed)
Post-transfusion (2u) hemoglobin increased appropriately from 5.9 to 7.8.   Pelvic ultrasound showed 10 x 12 cm hematoma at hysterotomy.  These results were discussed with the patient.  It appears that pelvic hematoma is stable per radiology and appropriate rise in hemoglobin after transfusion.  Will manage expectantly.  Plan to recheck hemoglobin in the morning.  Patient counseled re: signs and symptoms of abscess formation for which she is at increased risk.    Mitchel HonourMegan Jennelle Pinkstaff, DO

## 2015-07-18 NOTE — Lactation Note (Signed)
This note was copied from a baby's chart. Lactation Consultation Note  Patient Name: Girl Thea SilversmithWendy Pensinger ZOXWR'UToday's Date: 07/18/2015 Reason for consult: Follow-up assessment;Other (Comment);Infant weight loss (6% weight loss, slight elevation Bili check, mom was transfused wit 2 units this am , see LC note )  Baby 48 hours old. And has been breast feeding well both breast and per mom breast are feeling fuller and hearing more swallows.  When LC walked in baby already breast feeding fully dressed and a heavier blanket on top covering baby except for head.  Baby actively breast feeding with depth, multiply swallows noted, increased with breast compressions.  LC reminded mom since her milk is coming in skin to skin while feeding is important so the baby doesn't get to comfortable and non - nutritive for the feeding.  Mom has a fan going on the over bed table next to her bed. LC recommended to place the baby skin to skin under the blanket and watch for the baby getting to comfortable.  Baby fed 15 mins. LC reviewed doc flow sheets, breast feeding consistently, voids and stools Qs, Jaundice level slightly elevated.  Discussed with mom due to Hbg being low, even though transfused 2 units of PRBC"s ,  If the F/U Hbg hasn't increased significantly post pumping is indicated.  Mom receptive to discussion and MBU RN Alfonzo FellerHeather Gaither aware.  Baby is latching deeper today and per mom is less on and off compared to yesterday.  LC also recommended F/U for O/P appt. After D/C when that occurs to check on milk supply. Per mom has a DEBP at home.    Maternal Data    Feeding Feeding Type:  (baby latched presently, depth , multiply swallows noted ) Length of feed: 15 min (lc observed baby already latched, multiply swallows )  LATCH Score/Interventions Latch: Grasps breast easily, tongue down, lips flanged, rhythmical sucking. (latched with depth, flanged lips , already latched ) Intervention(s): Skin to skin;Teach  feeding cues;Waking techniques Intervention(s): Adjust position;Assist with latch;Breast massage;Breast compression  Audible Swallowing: Spontaneous and intermittent  Type of Nipple: Everted at rest and after stimulation  Comfort (Breast/Nipple): Filling, red/small blisters or bruises, mild/mod discomfort  Problem noted: Filling  Hold (Positioning): No assistance needed to correctly position infant at breast. Intervention(s): Breastfeeding basics reviewed;Support Pillows;Position options;Skin to skin  LATCH Score: 9  Lactation Tools Discussed/Used     Consult Status Consult Status: Follow-up (see LC note ) Date: 07/18/15 Follow-up type: In-patient    Kathrin Greathouseorio, Shanyia Stines Ann 07/18/2015, 1:49 PM

## 2015-07-18 NOTE — Clinical Documentation Improvement (Signed)
OB/GYN  Can the diagnosis of anemia be further specified?   Iron deficiency Anemia  Acute Blood Loss Anemia, including the suspected or known cause  Nutritional anemia, including the nutrition or mineral deficits  Chronic Anemia, including the suspected or known cause  Anemia of chronic disease, including the associated chronic disease state  Other  Clinically Undetermined  Document any associated diagnoses/conditions.   Supporting Information: Post operative course complicated by anemia---FeSO4 with meals. EBL 800 ml per 6/26 Anesthesia record. 6/28: Order to transfuse PRBC. Labs: H/H: 6/26: 8.8/26.4. 6/27: 7.5/22.2.   Please exercise your independent, professional judgment when responding. A specific answer is not anticipated or expected.   Thank Sabino DonovanYou,  Dessie Tatem [facility] [employer] [facility] [place] [phone]

## 2015-07-18 NOTE — Progress Notes (Signed)
Subjective: Postpartum Day 2: Cesarean Delivery Patient reports tolerating PO, + flatus and no problems voiding.    Objective: Vital signs in last 24 hours: Temp:  [97.7 F (36.5 C)-98.9 F (37.2 C)] 97.9 F (36.6 C) (06/28 0600) Pulse Rate:  [77-81] 77 (06/28 0600) Resp:  [18-21] 18 (06/28 0600) BP: (100-125)/(50-59) 104/50 mmHg (06/28 0600) SpO2:  [97 %-98 %] 98 % (06/27 1312)  Physical Exam:  General: alert and cooperative Lochia: appropriate Uterine Fundus: firm Incision: healing well DVT Evaluation: No evidence of DVT seen on physical exam. Negative Homan's sign. No cords or calf tenderness. Calf/Ankle edema is present.   Recent Labs  07/17/15 1253 07/18/15 0529  HGB 7.0* 5.9*  HCT 20.7* 17.5*    Assessment/Plan: Status post Cesarean section. Postoperative course complicated by anemia  Transfuse 2 PRBC's this am..  CURTIS,CAROL G 07/18/2015, 7:58 AM  Agree with above.   Patient currently receiving blood tx.  Symptomatic on standing with blurry vision and ringing in ears. Minimal lochia. Honeycomb dry.  Ecchymosis on mons; no induration Difficult to assess fundal height accurately because of habitus and discomfort.  R>L pain with palpation at level of umbilicus but no palpable abnormality. Plan to order ultrasound to evaluate for intrauterine source of blood loss. Recheck Hgb after 2 units of PRBCs have transfused.  Mitchel HonourMegan Kally Cadden, DO

## 2015-07-19 LAB — CBC
HCT: 18.9 % — ABNORMAL LOW (ref 36.0–46.0)
HEMATOCRIT: 20.2 % — AB (ref 36.0–46.0)
HEMOGLOBIN: 6.5 g/dL — AB (ref 12.0–15.0)
Hemoglobin: 7 g/dL — ABNORMAL LOW (ref 12.0–15.0)
MCH: 30.5 pg (ref 26.0–34.0)
MCH: 31.3 pg (ref 26.0–34.0)
MCHC: 34.4 g/dL (ref 30.0–36.0)
MCHC: 34.7 g/dL (ref 30.0–36.0)
MCV: 88.7 fL (ref 78.0–100.0)
MCV: 90.2 fL (ref 78.0–100.0)
Platelets: 270 10*3/uL (ref 150–400)
Platelets: 307 10*3/uL (ref 150–400)
RBC: 2.13 MIL/uL — ABNORMAL LOW (ref 3.87–5.11)
RBC: 2.24 MIL/uL — ABNORMAL LOW (ref 3.87–5.11)
RDW: 15.7 % — ABNORMAL HIGH (ref 11.5–15.5)
RDW: 15.7 % — AB (ref 11.5–15.5)
WBC: 18.2 10*3/uL — ABNORMAL HIGH (ref 4.0–10.5)
WBC: 15.5 10*3/uL — AB (ref 4.0–10.5)

## 2015-07-19 LAB — TYPE AND SCREEN
ABO/RH(D): O NEG
Antibody Screen: POSITIVE
DAT, IgG: NEGATIVE
Unit division: 0
Unit division: 0

## 2015-07-19 NOTE — Progress Notes (Signed)
Subjective: Postpartum Day 3: Cesarean Delivery Patient reports tolerating PO, + flatus and no problems voiding.    Objective: Vital signs in last 24 hours: Temp:  [97.8 F (36.6 C)-99 F (37.2 C)] 97.8 F (36.6 C) (06/29 [id number]) Pulse Rate:  [70-87] 70 (06/29 0632) Resp:  [18-20] 18 (06/29 [id number]) BP: (96-122)/(52-58) 122/57 mmHg (06/29 [id number]) SpO2:  [96 %-98 %] 96 % (06/28 1224)  Physical Exam:  General: alert and cooperative and tearful, appropriately Lochia: appropriate Uterine Fundus: firm Incision: healing well, honeycomb dressing replaced, small ecchymosis noted inferior to incision R> L extending to mons veneris DVT Evaluation: No evidence of DVT seen on physical exam. Negative Homan's sign. No cords or calf tenderness. Calf/Ankle edema is present.   Recent Labs  07/18/15 1612 07/19/15 0536  HGB 7.8* 6.5*  HCT 23.2* 18.9*    Assessment/Plan: Status post Cesarean section. Postoperative course complicated by anemia, s/p 2 units of PRBC's  Recheck CBC @1  Discussed Zoloft , does not feel depressed, declines medication intervention.  CURTIS,CAROL G 07/19/2015, 7:53 AM

## 2015-07-20 LAB — CBC
HCT: 20.1 % — ABNORMAL LOW (ref 36.0–46.0)
HEMOGLOBIN: 6.8 g/dL — AB (ref 12.0–15.0)
MCH: 30.5 pg (ref 26.0–34.0)
MCHC: 33.8 g/dL (ref 30.0–36.0)
MCV: 90.1 fL (ref 78.0–100.0)
Platelets: 321 10*3/uL (ref 150–400)
RBC: 2.23 MIL/uL — AB (ref 3.87–5.11)
RDW: 15.6 % — ABNORMAL HIGH (ref 11.5–15.5)
WBC: 13.2 10*3/uL — ABNORMAL HIGH (ref 4.0–10.5)

## 2015-07-20 MED ORDER — OXYCODONE-ACETAMINOPHEN 5-325 MG PO TABS: 1.0000 | ORAL_TABLET | ORAL | Status: DC | PRN

## 2015-07-20 MED ORDER — IBUPROFEN 800 MG PO TABS
800.0000 mg | ORAL_TABLET | Freq: Three times a day (TID) | ORAL | Status: DC | PRN
Start: 2015-07-20 — End: 2020-12-27

## 2015-07-20 MED ORDER — FERROUS SULFATE 325 (65 FE) MG PO TABS: 325.0000 mg | ORAL_TABLET | Freq: Three times a day (TID) | ORAL | Status: DC

## 2015-07-20 NOTE — Discharge Summary (Signed)
Obstetric Discharge Summary Reason for Admission: cesarean section Prenatal Procedures: ultrasound Intrapartum Procedures: cesarean: low cervical, transverse Postpartum Procedures: transfusion 2 units of PRBC's Complications-Operative and Postpartum: hemorrhage HEMOGLOBIN  Date Value Ref Range Status  07/20/2015 6.8* 12.0 - 15.0 g/dL Final    Comment:    REPEATED TO VERIFY CRITICAL RESULT CALLED TO, READ BACK BY AND VERIFIED WITH: [name],M.ON [id number] AT 0651 BY PATEL,S.    HCT  Date Value Ref Range Status  07/20/2015 20.1* 36.0 - 46.0 % Final    Physical Exam:  General: alert and cooperative Lochia: appropriate Uterine Fundus: firm Incision: healing well, small ecchymosis noted inferior to incision R> L, no seroma noted DVT Evaluation: No evidence of DVT seen on physical exam. Negative Homan's sign. No cords or calf tenderness. Calf/Ankle edema is present.  Discharge Diagnoses: Term Pregnancy-delivered  Discharge Information: Date: 07/20/2015 Activity: pelvic rest Diet: routine Medications: PNV, Ibuprofen, Iron, Percocet and synthroid Condition: stable Instructions: refer to practice specific booklet Discharge to: home   Newborn Data: Live born female  Birth Weight: 10 lb 6 oz (4705 g) APGAR: 8, 9  Home with mother.  Carolyn Knight G 07/20/2015, 8:20 AM

## 2015-10-17 ENCOUNTER — Telehealth (HOSPITAL_COMMUNITY): Payer: Self-pay

## 2015-10-17 NOTE — Telephone Encounter (Signed)
Mom called with questions regarding lower supply with pumping and returning to work.  LC disucssed at length all issues related to low supply including pumping frequency, power pumping, supplements and educating daycare staff on normal bottle feeding for breast feedings.  Mom has been going 6 hours during day without pumping due to schedule difficulty and again at night with baby sleeping longer at night. LC encouraged mom to pump at least every 3-4 hours to increase supply.  LC offered support and encouragement.  Mom plans to try to increase pumping and plans to attend a Monday night support group.

## 2018-01-14 ENCOUNTER — Other Ambulatory Visit: Payer: Self-pay | Admitting: Obstetrics and Gynecology

## 2018-01-14 DIAGNOSIS — R928 Other abnormal and inconclusive findings on diagnostic imaging of breast: Secondary | ICD-10-CM

## 2018-01-19 ENCOUNTER — Ambulatory Visit
Admission: RE | Admit: 2018-01-19 | Discharge: 2018-01-19 | Disposition: A | Discharge status: Home / Self Care | Payer: BLUE CROSS/BLUE SHIELD | Source: Ambulatory Visit | Attending: Obstetrics and Gynecology | Admitting: Obstetrics and Gynecology

## 2018-01-19 ENCOUNTER — Ambulatory Visit: Payer: BC Managed Care – PPO

## 2018-01-19 DIAGNOSIS — R928 Other abnormal and inconclusive findings on diagnostic imaging of breast: Secondary | ICD-10-CM

## 2019-08-25 ENCOUNTER — Other Ambulatory Visit: Payer: Self-pay | Admitting: Obstetrics and Gynecology

## 2019-08-25 DIAGNOSIS — R928 Other abnormal and inconclusive findings on diagnostic imaging of breast: Secondary | ICD-10-CM

## 2019-09-06 ENCOUNTER — Other Ambulatory Visit: Payer: BLUE CROSS/BLUE SHIELD

## 2019-09-07 ENCOUNTER — Ambulatory Visit
Admission: RE | Admit: 2019-09-07 | Discharge: 2019-09-07 | Disposition: A | Discharge status: Home / Self Care | Payer: BC Managed Care – PPO | Source: Ambulatory Visit | Attending: Obstetrics and Gynecology | Admitting: Obstetrics and Gynecology

## 2019-09-07 ENCOUNTER — Other Ambulatory Visit: Payer: Self-pay

## 2019-09-07 ENCOUNTER — Ambulatory Visit
Admission: RE | Admit: 2019-09-07 | Discharge: 2019-09-07 | Disposition: A | Discharge status: Home / Self Care | Payer: BLUE CROSS/BLUE SHIELD | Source: Ambulatory Visit | Attending: Obstetrics and Gynecology | Admitting: Obstetrics and Gynecology

## 2019-09-07 DIAGNOSIS — R928 Other abnormal and inconclusive findings on diagnostic imaging of breast: Secondary | ICD-10-CM

## 2020-06-14 LAB — OB RESULTS CONSOLE RPR: RPR: NONREACTIVE

## 2020-06-14 LAB — OB RESULTS CONSOLE HEPATITIS B SURFACE ANTIGEN: Hepatitis B Surface Ag: NEGATIVE

## 2020-06-14 LAB — HEPATITIS C ANTIBODY: HCV Ab: NEGATIVE

## 2020-06-14 LAB — OB RESULTS CONSOLE RUBELLA ANTIBODY, IGM: Rubella: IMMUNE

## 2020-10-10 LAB — OB RESULTS CONSOLE HIV ANTIBODY (ROUTINE TESTING): HIV: NONREACTIVE

## 2020-12-12 LAB — OB RESULTS CONSOLE GBS: GBS: NEGATIVE

## 2020-12-31 ENCOUNTER — Inpatient Hospital Stay (HOSPITAL_COMMUNITY)
Admission: AD | Admit: 2020-12-31 | Discharge: 2021-01-02 | DRG: 785 | Disposition: A | Discharge status: Home / Self Care | Payer: BC Managed Care – PPO | Attending: Obstetrics & Gynecology | Admitting: Obstetrics & Gynecology

## 2020-12-31 ENCOUNTER — Encounter (HOSPITAL_COMMUNITY)
Admission: RE | Admit: 2020-12-31 | Discharge: 2020-12-31 | Disposition: A | Discharge status: Home / Self Care | Payer: BC Managed Care – PPO | Source: Ambulatory Visit | Attending: Obstetrics and Gynecology | Admitting: Obstetrics and Gynecology

## 2020-12-31 ENCOUNTER — Other Ambulatory Visit: Payer: Self-pay

## 2020-12-31 ENCOUNTER — Encounter (HOSPITAL_COMMUNITY)
Admission: AD | Disposition: A | Discharge status: Home / Self Care | Payer: Self-pay | Source: Home / Self Care | Attending: Obstetrics & Gynecology

## 2020-12-31 ENCOUNTER — Encounter (HOSPITAL_COMMUNITY): Payer: Self-pay

## 2020-12-31 ENCOUNTER — Inpatient Hospital Stay (HOSPITAL_COMMUNITY): Payer: BC Managed Care – PPO | Admitting: Anesthesiology

## 2020-12-31 DIAGNOSIS — Z302 Encounter for sterilization: Secondary | ICD-10-CM

## 2020-12-31 DIAGNOSIS — E039 Hypothyroidism, unspecified: Secondary | ICD-10-CM | POA: Diagnosis present

## 2020-12-31 DIAGNOSIS — O9952 Diseases of the respiratory system complicating childbirth: Secondary | ICD-10-CM | POA: Diagnosis present

## 2020-12-31 DIAGNOSIS — Z6791 Unspecified blood type, Rh negative: Secondary | ICD-10-CM | POA: Diagnosis not present

## 2020-12-31 DIAGNOSIS — Z20822 Contact with and (suspected) exposure to covid-19: Secondary | ICD-10-CM | POA: Diagnosis present

## 2020-12-31 DIAGNOSIS — O26893 Other specified pregnancy related conditions, third trimester: Secondary | ICD-10-CM | POA: Diagnosis present

## 2020-12-31 DIAGNOSIS — O99284 Endocrine, nutritional and metabolic diseases complicating childbirth: Secondary | ICD-10-CM | POA: Diagnosis present

## 2020-12-31 DIAGNOSIS — O34211 Maternal care for low transverse scar from previous cesarean delivery: Secondary | ICD-10-CM | POA: Diagnosis present

## 2020-12-31 DIAGNOSIS — J45909 Unspecified asthma, uncomplicated: Secondary | ICD-10-CM | POA: Diagnosis present

## 2020-12-31 DIAGNOSIS — O4292 Full-term premature rupture of membranes, unspecified as to length of time between rupture and onset of labor: Principal | ICD-10-CM | POA: Diagnosis present

## 2020-12-31 DIAGNOSIS — O99214 Obesity complicating childbirth: Secondary | ICD-10-CM | POA: Diagnosis present

## 2020-12-31 DIAGNOSIS — Z98891 History of uterine scar from previous surgery: Principal | ICD-10-CM

## 2020-12-31 DIAGNOSIS — Z3A38 38 weeks gestation of pregnancy: Secondary | ICD-10-CM

## 2020-12-31 LAB — CBC
HCT: 38.2 % (ref 36.0–46.0)
Hemoglobin: 12 g/dL (ref 12.0–15.0)
MCH: 28.2 pg (ref 26.0–34.0)
MCHC: 31.4 g/dL (ref 30.0–36.0)
MCV: 89.7 fL (ref 80.0–100.0)
Platelets: 343 10*3/uL (ref 150–400)
RBC: 4.26 MIL/uL (ref 3.87–5.11)
RDW: 15.2 % (ref 11.5–15.5)
WBC: 12.1 10*3/uL — ABNORMAL HIGH (ref 4.0–10.5)
nRBC: 0 % (ref 0.0–0.2)

## 2020-12-31 LAB — RESP PANEL BY RT-PCR (FLU A&B, COVID) ARPGX2
Influenza A by PCR: NEGATIVE
Influenza B by PCR: NEGATIVE
SARS Coronavirus 2 by RT PCR: NEGATIVE

## 2020-12-31 LAB — TYPE AND SCREEN
ABO/RH(D): O NEG
Antibody Screen: NEGATIVE

## 2020-12-31 SURGERY — Surgical Case
Anesthesia: Spinal

## 2020-12-31 MED ORDER — OXYCODONE HCL 5 MG PO TABS: 5.0000 mg | ORAL_TABLET | Freq: Once | ORAL | Status: DC | PRN

## 2020-12-31 MED ORDER — NALOXONE HCL 0.4 MG/ML IJ SOLN: 0.4000 mg | INTRAMUSCULAR | Status: DC | PRN

## 2020-12-31 MED ORDER — DEXAMETHASONE SODIUM PHOSPHATE 10 MG/ML IJ SOLN
INTRAMUSCULAR | Status: DC | PRN
  Administered 2020-12-31: 10 mg via INTRAVENOUS

## 2020-12-31 MED ORDER — STERILE WATER FOR IRRIGATION IR SOLN
Status: DC | PRN
  Administered 2020-12-31: 1000 mL
  Administered 2020-12-31: 1

## 2020-12-31 MED ORDER — ACETAMINOPHEN 500 MG PO TABS: 1000.0000 mg | ORAL_TABLET | Freq: Four times a day (QID) | ORAL | Status: DC

## 2020-12-31 MED ORDER — SENNOSIDES-DOCUSATE SODIUM 8.6-50 MG PO TABS
2.0000 | ORAL_TABLET | Freq: Every day | ORAL | Status: DC
  Administered 2021-01-01 – 2021-01-02 (×2): 2 via ORAL
  Filled 2020-12-31 (×2): qty 2

## 2020-12-31 MED ORDER — BUPIVACAINE IN DEXTROSE 0.75-8.25 % IT SOLN
INTRATHECAL | Status: DC | PRN
  Administered 2020-12-31: 1.55 mL via INTRATHECAL

## 2020-12-31 MED ORDER — ACETAMINOPHEN 325 MG PO TABS: 325.0000 mg | ORAL_TABLET | ORAL | Status: DC | PRN

## 2020-12-31 MED ORDER — OXYCODONE HCL 5 MG/5ML PO SOLN: 5.0000 mg | Freq: Once | ORAL | Status: DC | PRN

## 2020-12-31 MED ORDER — DIPHENHYDRAMINE HCL 50 MG/ML IJ SOLN: 12.5000 mg | INTRAMUSCULAR | Status: DC | PRN

## 2020-12-31 MED ORDER — SODIUM CHLORIDE 0.9% FLUSH: 3.0000 mL | INTRAVENOUS | Status: DC | PRN

## 2020-12-31 MED ORDER — NALOXONE HCL 4 MG/10ML IJ SOLN: 1.0000 ug/kg/h | INTRAVENOUS | Status: DC | PRN

## 2020-12-31 MED ORDER — ONDANSETRON HCL 4 MG/2ML IJ SOLN: 4.0000 mg | Freq: Once | INTRAMUSCULAR | Status: DC | PRN

## 2020-12-31 MED ORDER — ONDANSETRON HCL 4 MG/2ML IJ SOLN: 4.0000 mg | Freq: Three times a day (TID) | INTRAMUSCULAR | Status: DC | PRN

## 2020-12-31 MED ORDER — ACETAMINOPHEN 500 MG PO TABS
1000.0000 mg | ORAL_TABLET | Freq: Four times a day (QID) | ORAL | Status: DC
  Administered 2021-01-01 – 2021-01-02 (×6): 1000 mg via ORAL
  Filled 2020-12-31 (×6): qty 2

## 2020-12-31 MED ORDER — SCOPOLAMINE 1 MG/3DAYS TD PT72
1.0000 | MEDICATED_PATCH | Freq: Once | TRANSDERMAL | Status: DC
  Administered 2020-12-31: 1.5 mg via TRANSDERMAL
  Filled 2020-12-31: qty 1

## 2020-12-31 MED ORDER — ZOLPIDEM TARTRATE 5 MG PO TABS: 5.0000 mg | ORAL_TABLET | Freq: Every evening | ORAL | Status: DC | PRN

## 2020-12-31 MED ORDER — FENTANYL CITRATE (PF) 100 MCG/2ML IJ SOLN
INTRAMUSCULAR | Status: DC | PRN
  Administered 2020-12-31: 15 ug via INTRATHECAL

## 2020-12-31 MED ORDER — DIPHENHYDRAMINE HCL 25 MG PO CAPS: 25.0000 mg | ORAL_CAPSULE | ORAL | Status: DC | PRN

## 2020-12-31 MED ORDER — SODIUM CHLORIDE 0.9 % IV SOLN
2.0000 g | INTRAVENOUS | Status: DC
  Filled 2020-12-31: qty 2

## 2020-12-31 MED ORDER — NALOXONE HCL 4 MG/10ML IJ SOLN
1.0000 ug/kg/h | INTRAVENOUS | Status: DC | PRN
  Filled 2020-12-31: qty 5

## 2020-12-31 MED ORDER — PHENYLEPHRINE HCL-NACL 20-0.9 MG/250ML-% IV SOLN
INTRAVENOUS | Status: AC
  Filled 2020-12-31: qty 250

## 2020-12-31 MED ORDER — LEVOTHYROXINE SODIUM 75 MCG PO TABS: 75.0000 ug | ORAL_TABLET | Freq: Every day | ORAL | Status: DC

## 2020-12-31 MED ORDER — DIPHENHYDRAMINE HCL 25 MG PO CAPS: 25.0000 mg | ORAL_CAPSULE | Freq: Four times a day (QID) | ORAL | Status: DC | PRN

## 2020-12-31 MED ORDER — LIOTHYRONINE SODIUM 5 MCG PO TABS: 5.0000 ug | ORAL_TABLET | Freq: Every day | ORAL | Status: DC

## 2020-12-31 MED ORDER — OXYTOCIN-SODIUM CHLORIDE 30-0.9 UT/500ML-% IV SOLN
INTRAVENOUS | Status: AC
  Filled 2020-12-31: qty 500

## 2020-12-31 MED ORDER — POVIDONE-IODINE 10 % EX SWAB
2.0000 "application " | Freq: Once | CUTANEOUS | Status: AC
  Administered 2020-12-31: 2 via TOPICAL

## 2020-12-31 MED ORDER — MORPHINE SULFATE (PF) 0.5 MG/ML IJ SOLN
INTRAMUSCULAR | Status: DC | PRN
  Administered 2020-12-31: 150 ug via INTRATHECAL

## 2020-12-31 MED ORDER — FENTANYL CITRATE (PF) 100 MCG/2ML IJ SOLN: 25.0000 ug | INTRAMUSCULAR | Status: DC | PRN

## 2020-12-31 MED ORDER — OXYTOCIN-SODIUM CHLORIDE 30-0.9 UT/500ML-% IV SOLN
2.5000 [IU]/h | INTRAVENOUS | Status: AC
  Administered 2020-12-31: 2.5 [IU]/h via INTRAVENOUS

## 2020-12-31 MED ORDER — KETOROLAC TROMETHAMINE 30 MG/ML IJ SOLN
INTRAMUSCULAR | Status: AC
  Filled 2020-12-31: qty 1

## 2020-12-31 MED ORDER — WITCH HAZEL-GLYCERIN EX PADS: 1.0000 "application " | MEDICATED_PAD | CUTANEOUS | Status: DC | PRN

## 2020-12-31 MED ORDER — ONDANSETRON HCL 4 MG/2ML IJ SOLN
INTRAMUSCULAR | Status: AC
  Filled 2020-12-31: qty 2

## 2020-12-31 MED ORDER — SIMETHICONE 80 MG PO CHEW
80.0000 mg | CHEWABLE_TABLET | Freq: Three times a day (TID) | ORAL | Status: DC
  Administered 2021-01-01 – 2021-01-02 (×3): 80 mg via ORAL
  Filled 2020-12-31 (×4): qty 1

## 2020-12-31 MED ORDER — TETANUS-DIPHTH-ACELL PERTUSSIS 5-2.5-18.5 LF-MCG/0.5 IM SUSY: 0.5000 mL | PREFILLED_SYRINGE | Freq: Once | INTRAMUSCULAR | Status: DC

## 2020-12-31 MED ORDER — MEPERIDINE HCL 25 MG/ML IJ SOLN: 6.2500 mg | INTRAMUSCULAR | Status: DC | PRN

## 2020-12-31 MED ORDER — LIOTHYRONINE SODIUM 5 MCG PO TABS
5.0000 ug | ORAL_TABLET | Freq: Every day | ORAL | Status: DC
  Administered 2021-01-01 – 2021-01-02 (×2): 5 ug via ORAL
  Filled 2020-12-31 (×3): qty 1

## 2020-12-31 MED ORDER — PRENATAL MULTIVITAMIN CH
1.0000 | ORAL_TABLET | Freq: Every day | ORAL | Status: DC
  Administered 2021-01-01 – 2021-01-02 (×2): 1 via ORAL
  Filled 2020-12-31: qty 1

## 2020-12-31 MED ORDER — COCONUT OIL OIL: 1.0000 "application " | TOPICAL_OIL | Status: DC | PRN

## 2020-12-31 MED ORDER — FAMOTIDINE 20 MG PO TABS
20.0000 mg | ORAL_TABLET | Freq: Once | ORAL | Status: AC
  Administered 2020-12-31: 20 mg via ORAL
  Filled 2020-12-31: qty 1

## 2020-12-31 MED ORDER — ALBUTEROL SULFATE (2.5 MG/3ML) 0.083% IN NEBU: 2.5000 mg | INHALATION_SOLUTION | Freq: Four times a day (QID) | RESPIRATORY_TRACT | Status: DC | PRN

## 2020-12-31 MED ORDER — LACTATED RINGERS IV SOLN: INTRAVENOUS | Status: DC | PRN

## 2020-12-31 MED ORDER — MORPHINE SULFATE (PF) 0.5 MG/ML IJ SOLN
INTRAMUSCULAR | Status: AC
  Filled 2020-12-31: qty 10

## 2020-12-31 MED ORDER — LEVOTHYROXINE SODIUM 75 MCG PO TABS
75.0000 ug | ORAL_TABLET | Freq: Every day | ORAL | Status: DC
Start: 2021-01-01 — End: 2021-01-02
  Administered 2021-01-01 – 2021-01-02 (×2): 75 ug via ORAL
  Filled 2020-12-31 (×2): qty 1

## 2020-12-31 MED ORDER — SCOPOLAMINE 1 MG/3DAYS TD PT72: 1.0000 | MEDICATED_PATCH | Freq: Once | TRANSDERMAL | Status: DC

## 2020-12-31 MED ORDER — IBUPROFEN 800 MG PO TABS
800.0000 mg | ORAL_TABLET | Freq: Four times a day (QID) | ORAL | Status: DC
  Administered 2021-01-01 – 2021-01-02 (×4): 800 mg via ORAL
  Filled 2020-12-31 (×4): qty 1

## 2020-12-31 MED ORDER — ACETAMINOPHEN 160 MG/5ML PO SOLN: 325.0000 mg | ORAL | Status: DC | PRN

## 2020-12-31 MED ORDER — PHENYLEPHRINE HCL-NACL 20-0.9 MG/250ML-% IV SOLN
INTRAVENOUS | Status: DC | PRN
  Administered 2020-12-31: 60 ug/min via INTRAVENOUS

## 2020-12-31 MED ORDER — DEXAMETHASONE SODIUM PHOSPHATE 10 MG/ML IJ SOLN
INTRAMUSCULAR | Status: AC
  Filled 2020-12-31: qty 1

## 2020-12-31 MED ORDER — KETOROLAC TROMETHAMINE 30 MG/ML IJ SOLN: 30.0000 mg | Freq: Four times a day (QID) | INTRAMUSCULAR | Status: DC | PRN

## 2020-12-31 MED ORDER — KETOROLAC TROMETHAMINE 30 MG/ML IJ SOLN: 30.0000 mg | Freq: Four times a day (QID) | INTRAMUSCULAR | Status: AC | PRN

## 2020-12-31 MED ORDER — OXYCODONE HCL 5 MG PO TABS
5.0000 mg | ORAL_TABLET | ORAL | Status: DC | PRN
  Administered 2021-01-01: 5 mg via ORAL
  Administered 2021-01-02 (×4): 10 mg via ORAL
  Filled 2020-12-31 (×3): qty 2
  Filled 2020-12-31: qty 1
  Filled 2020-12-31: qty 2

## 2020-12-31 MED ORDER — ONDANSETRON HCL 4 MG/2ML IJ SOLN
INTRAMUSCULAR | Status: DC | PRN
  Administered 2020-12-31: 4 mg via INTRAVENOUS

## 2020-12-31 MED ORDER — MENTHOL 3 MG MT LOZG: 1.0000 | LOZENGE | OROMUCOSAL | Status: DC | PRN

## 2020-12-31 MED ORDER — SODIUM CHLORIDE 0.9 % IR SOLN
Status: DC | PRN
  Administered 2020-12-31 (×3): 1

## 2020-12-31 MED ORDER — KETOROLAC TROMETHAMINE 30 MG/ML IJ SOLN
30.0000 mg | Freq: Four times a day (QID) | INTRAMUSCULAR | Status: AC | PRN
  Administered 2020-12-31 – 2021-01-01 (×2): 30 mg via INTRAVENOUS
  Filled 2020-12-31: qty 1

## 2020-12-31 MED ORDER — SODIUM CHLORIDE 0.9 % IV SOLN: 12.5000 mg | INTRAVENOUS | Status: DC

## 2020-12-31 MED ORDER — DIBUCAINE (PERIANAL) 1 % EX OINT: 1.0000 "application " | TOPICAL_OINTMENT | CUTANEOUS | Status: DC | PRN

## 2020-12-31 MED ORDER — FENTANYL CITRATE (PF) 100 MCG/2ML IJ SOLN
INTRAMUSCULAR | Status: AC
  Filled 2020-12-31: qty 2

## 2020-12-31 MED ORDER — SIMETHICONE 80 MG PO CHEW
80.0000 mg | CHEWABLE_TABLET | ORAL | Status: DC | PRN
  Administered 2021-01-01: 80 mg via ORAL

## 2020-12-31 MED ORDER — SODIUM CHLORIDE 0.9 % IV SOLN
12.5000 mg | Freq: Four times a day (QID) | INTRAVENOUS | Status: DC | PRN
  Administered 2020-12-31: 12.5 mg via INTRAVENOUS
  Filled 2020-12-31: qty 0.5

## 2020-12-31 MED ORDER — LACTATED RINGERS IV SOLN: INTRAVENOUS | Status: DC

## 2020-12-31 MED ORDER — SOD CITRATE-CITRIC ACID 500-334 MG/5ML PO SOLN
30.0000 mL | Freq: Once | ORAL | Status: AC
  Administered 2020-12-31: 30 mL via ORAL
  Filled 2020-12-31: qty 30

## 2020-12-31 MED ORDER — ACETAMINOPHEN 500 MG PO TABS
1000.0000 mg | ORAL_TABLET | Freq: Once | ORAL | Status: AC
  Administered 2020-12-31: 1000 mg via ORAL
  Filled 2020-12-31: qty 2

## 2020-12-31 MED ORDER — SODIUM CHLORIDE 0.9 % IV SOLN
2.0000 g | INTRAVENOUS | Status: AC
  Administered 2020-12-31: 2 g via INTRAVENOUS
  Filled 2020-12-31: qty 2

## 2020-12-31 MED ORDER — SODIUM CHLORIDE 0.9 % IV SOLN: INTRAVENOUS | Status: DC | PRN

## 2020-12-31 SURGICAL SUPPLY — 38 items
BENZOIN TINCTURE PRP APPL 2/3 (GAUZE/BANDAGES/DRESSINGS) ×3 IMPLANT
CHLORAPREP W/TINT 26ML (MISCELLANEOUS) ×3 IMPLANT
CLAMP CORD UMBIL (MISCELLANEOUS) IMPLANT
CLOSURE STERI-STRIP 1/2X4 (GAUZE/BANDAGES/DRESSINGS) ×1
CLOSURE WOUND 1/2 X4 (GAUZE/BANDAGES/DRESSINGS) ×1
CLOTH BEACON ORANGE TIMEOUT ST (SAFETY) ×3 IMPLANT
CLSR STERI-STRIP ANTIMIC 1/2X4 (GAUZE/BANDAGES/DRESSINGS) ×1 IMPLANT
DERMABOND ADVANCED (GAUZE/BANDAGES/DRESSINGS)
DERMABOND ADVANCED .7 DNX12 (GAUZE/BANDAGES/DRESSINGS) IMPLANT
DRSG OPSITE POSTOP 4X10 (GAUZE/BANDAGES/DRESSINGS) ×3 IMPLANT
ELECT REM PT RETURN 9FT ADLT (ELECTROSURGICAL) ×3
ELECTRODE REM PT RTRN 9FT ADLT (ELECTROSURGICAL) ×1 IMPLANT
EXTRACTOR VACUUM KIWI (MISCELLANEOUS) ×2 IMPLANT
GLOVE BIO SURGEON STRL SZ 6 (GLOVE) ×3 IMPLANT
GLOVE BIOGEL PI IND STRL 6 (GLOVE) ×2 IMPLANT
GLOVE BIOGEL PI IND STRL 7.0 (GLOVE) ×1 IMPLANT
GLOVE BIOGEL PI INDICATOR 6 (GLOVE) ×4
GLOVE BIOGEL PI INDICATOR 7.0 (GLOVE) ×2
GOWN STRL REUS W/TWL LRG LVL3 (GOWN DISPOSABLE) ×6 IMPLANT
KIT ABG SYR 3ML LUER SLIP (SYRINGE) ×3 IMPLANT
NDL HYPO 25X5/8 SAFETYGLIDE (NEEDLE) ×1 IMPLANT
NEEDLE HYPO 25X5/8 SAFETYGLIDE (NEEDLE) ×3 IMPLANT
NS IRRIG 1000ML POUR BTL (IV SOLUTION) ×3 IMPLANT
PACK C SECTION WH (CUSTOM PROCEDURE TRAY) ×3 IMPLANT
PAD OB MATERNITY 4.3X12.25 (PERSONAL CARE ITEMS) ×3 IMPLANT
PENCIL SMOKE EVAC W/HOLSTER (ELECTROSURGICAL) ×3 IMPLANT
STRIP CLOSURE SKIN 1/2X4 (GAUZE/BANDAGES/DRESSINGS) ×1 IMPLANT
SUT CHROMIC 0 CTX 36 (SUTURE) ×9 IMPLANT
SUT MON AB 2-0 CT1 27 (SUTURE) ×3 IMPLANT
SUT PDS AB 0 CT1 27 (SUTURE) IMPLANT
SUT PDS AB 0 CTX 60 (SUTURE) ×2 IMPLANT
SUT PLAIN 0 NONE (SUTURE) ×2 IMPLANT
SUT PLAIN 2 0 XLH (SUTURE) ×2 IMPLANT
SUT VIC AB 0 CT1 36 (SUTURE) IMPLANT
SUT VIC AB 4-0 KS 27 (SUTURE) IMPLANT
TOWEL OR 17X24 6PK STRL BLUE (TOWEL DISPOSABLE) ×3 IMPLANT
TRAY FOLEY W/BAG SLVR 14FR LF (SET/KITS/TRAYS/PACK) IMPLANT
WATER STERILE IRR 1000ML POUR (IV SOLUTION) ×3 IMPLANT

## 2020-12-31 NOTE — MAU Note (Signed)
Carolyn Knight is a 40 y.o. at [redacted]w[redacted]d here in MAU reporting: LOF for the past 45 minutes. States clear with some bloody. States having some contractions, they started last night and have gotten worse today with the LOF. +FM. States she is scheduled for RCS on Wednesday.  Onset of complaint: today  Pain score: 5/10  FHT:+FM  Lab orders placed from triage: none

## 2020-12-31 NOTE — Op Note (Signed)
Carolyn Knight PROCEDURE DATE: 12/31/2020  PREOPERATIVE DIAGNOSIS: Intrauterine pregnancy at  [redacted]w[redacted]d weeks gestation, PROM, previous cesarean delivery, desires sterility  POSTOPERATIVE DIAGNOSIS: The same  PROCEDURE:  Repeat Low Transverse Cesarean Section with Bilateral Tubal Ligation  SURGEON:  Dr. Mitchel Honour  INDICATIONS: Carolyn Knight is a 40 y.o. [phone] at [redacted]w[redacted]d scheduled for cesarean section and bilateral tubal ligation secondary to PROM and desire for repeat and sterility.  The risks of cesarean section discussed with the patient included but were not limited to: bleeding which may require transfusion or reoperation; infection which may require antibiotics; injury to bowel, bladder, ureters or other surrounding organs; injury to the fetus; need for additional procedures including hysterectomy in the event of a life-threatening hemorrhage; placental abnormalities wth subsequent pregnancies, incisional problems, thromboembolic phenomenon and other postoperative/anesthesia complications. The patient concurred with the proposed plan, giving informed written consent for the procedure.    FINDINGS:  Viable female infant in cephalic presentation, APGARs 8,9: weight pending  Clear amniotic fluid.  Intact placenta, three vessel cord.  Grossly normal uterus, ovaries and fallopian tubes. .   ANESTHESIA:  Spinal ESTIMATED BLOOD LOSS: 600 ml SPECIMENS: Placenta sent to L&D, bilateral tubal segments sent to pathology COMPLICATIONS: None immediate  PROCEDURE IN DETAIL:  The patient received intravenous antibiotics and had sequential compression devices applied to her lower extremities while in the preoperative area.  She was then taken to the operating room where spinal anesthesia was administered and was found to be adequate. She was then placed in a dorsal supine position with a leftward tilt, and prepped and draped in a sterile manner.  A foley catheter was placed into her bladder and attached to  constant gravity.  After an adequate timeout was performed, a Pfannenstiel skin incision was made with scalpel and carried through to the underlying layer of fascia. The fascia was incised in the midline and this incision was extended bilaterally using the Mayo scissors. Kocher clamps were applied to the superior aspect of the fascial incision and the underlying rectus muscles were dissected off bluntly. A similar process was carried out on the inferior aspect of the facial incision. The rectus muscles were separated in the midline bluntly and the peritoneum was entered bluntly.  Bladder flap was created sharply and developed bluntly.  Bladder blade was placed.  A transverse hysterotomy was made with a scalpel and extended bilaterally bluntly. The bladder blade was then removed. The infant was successfully delivered using a single Kiwi vacuum pull, and cord was clamped and cut and infant was handed over to awaiting neonatology team. Uterine massage was then administered and the placenta delivered intact with three-vessel cord. The uterus was cleared of clot and debris.  The hysterotomy was closed with 0 vicryl.  A second imbricating suture of 0-Vicryl was used to reinforce the incision and aid in hemostasis.  The peritoneum and rectus muscles were noted to be hemostatic and were reapproximated using 2-0 monocryl in a running fashion.  The fascia was closed with 0-PDS in a running fashion with good restoration of anatomy.  The subcutaneus tissue was copiously irrigated and was reapproximated using 3 interrupted plain gut stitches.  The skin was closed with 4-0 vicryl in a subcuticular fashion.  Pt tolerated the procedure will.  All counts were correct x2.  Pt went to the recovery room in stable condition.

## 2020-12-31 NOTE — Anesthesia Procedure Notes (Signed)
Spinal  Patient location during procedure: OR Start time: 12/31/2020 6:25 PM End time: 12/31/2020 6:30 PM Reason for block: surgical anesthesia Staffing Anesthesiologist: Bethena Midget, MD Preanesthetic Checklist Completed: patient identified, IV checked, site marked, risks and benefits discussed, surgical consent, monitors and equipment checked, pre-op evaluation and timeout performed Spinal Block Patient position: sitting Prep: DuraPrep Patient monitoring: heart rate, cardiac monitor, continuous pulse ox and blood pressure Approach: midline Location: L4-5 Injection technique: single-shot Needle Needle type: Sprotte  Needle gauge: 24 G Needle length: 9 cm Assessment Sensory level: T4 Events: CSF return

## 2020-12-31 NOTE — Lactation Note (Signed)
This note was copied from a baby's chart. Lactation Consultation Note  Patient Name: Carolyn Knight [id number] Date: 12/31/2020 Reason for consult: Initial assessment;Mother's request;Early term 37-38.6wks;Breastfeeding assistance;Maternal endocrine disorder Age:40 hours Mom feeding plan for now EBF.   LC assisted with latching in cross cradle with increase in depth of swallows with breast compression.   Plan 1. To feed based on cues 8-12x 24hr period. Mom to offer breasts with signs of milk transfer.  2. If unable to latch, Mom to hand express and spoon feed with 5-7 ml of colostrum 3. I and O sheet reviewed.  All questions answered at the end of the visit.   Maternal Data Has patient been taught Hand Expression?: Yes Does the patient have breastfeeding experience prior to this delivery?: Yes How long did the patient breastfeed?: 18 months and the others 6 months ( low milk supply after going back to work.) Did not feel her hypothyroidism impacted her milk supply.  Feeding Mother's Current Feeding Choice: Breast Milk  LATCH Score Latch: Repeated attempts needed to sustain latch, nipple held in mouth throughout feeding, stimulation needed to elicit sucking reflex.  Audible Swallowing: Spontaneous and intermittent  Type of Nipple: Everted at rest and after stimulation  Comfort (Breast/Nipple): Soft / non-tender  Hold (Positioning): Assistance needed to correctly position infant at breast and maintain latch.  LATCH Score: 8   Lactation Tools Discussed/Used    Interventions Interventions: Breast feeding basics reviewed;Assisted with latch;Skin to skin;Breast massage;Hand express;Breast compression;Adjust position;Support pillows;Position options;Expressed milk;Education;LC Psychologist, educational;Visual merchandiser education  Discharge    Consult Status Consult Status: Follow-up Date: 01/01/21 Follow-up type: In-patient    Armistead Sult   Nicholson-Springer 12/31/2020, 11:31 PM

## 2020-12-31 NOTE — Patient Instructions (Signed)
Zhavia Cunanan  12/31/2020   Your procedure is scheduled on:  01/02/2021  Arrive at 6:15 AM at Entrance C on [address] at [facility]  and [employer]. You are invited to use the FREE valet parking or use the Visitor's parking deck.  Pick up the phone at the desk and dial [phone].  Call this number if you have problems the morning of surgery: [phone]  Remember:   Do not eat food:(After Midnight) Desps de medianoche.  Do not drink clear liquids: (After Midnight) Desps de medianoche.  Take these medicines the morning of surgery with A SIP OF WATER:  none   Do not wear jewelry, make-up or nail polish.  Do not wear lotions, powders, or perfumes. Do not wear deodorant.  Do not shave 48 hours prior to surgery.  Do not bring valuables to the hospital.  [facility] is not   responsible for any belongings or valuables brought to the hospital.  Contacts, dentures or bridgework may not be worn into surgery.  Leave suitcase in the car. After surgery it may be brought to your room.  For patients admitted to the hospital, checkout time is 11:00 AM the day of              discharge.      Please read over the following fact sheets that you were given:     Preparing for Surgery

## 2020-12-31 NOTE — H&P (Signed)
Carolyn Knight is a 40 y.o. female [phone] at [redacted]w[redacted]d presenting for PROM at noon today.  Patient reports CTX q 5-10 minutes.  Active FM.  No VB.  Patient had previous C/S with last delivery for macrosomia (10#6) and postop course was complicated by pelvic hematoma; received blood transfusion.  Patient has hypothyroidism which has been well controlled on Synthroid and Cytomel; managed by endo.  Patient has asthma and uses albuterol prn; stable this pregnancy.  Patient last ate protein shake and banana around noon.  GBS negative.  Patient desires repeat C/S and BTL.  OB History     Gravida  4   Para  3   Term  3   Preterm      AB      Living  3      SAB      IAB      Ectopic      Multiple  0   Live Births  3          Past Medical History:  Diagnosis Date   Asthma    sports induced   Celiac disease    Depression    pp with first pregnancy   Headache    Hx of varicella    Hypothyroidism    synthroid   Past Surgical History:  Procedure Laterality Date   CESAREAN SECTION N/A 07/16/2015   Procedure: CESAREAN SECTION;  Surgeon: Richarda Overlie, MD;  Location: [facility] BIRTHING SUITES;  Service: Obstetrics;  Laterality: N/A;  Primary edc 07/23/15 NKDA    TONSILLECTOMY     Family History: family history includes Breast cancer in her maternal aunt and maternal grandmother; Cancer in her maternal aunt, maternal grandfather, and maternal grandmother; Diabetes in her maternal grandfather, maternal grandmother, and mother; Heart disease in her paternal grandfather; Hypertension in her maternal aunt, maternal grandmother, and mother; Rheum arthritis in her mother. Social History:  reports that she has never smoked. She has never used smokeless tobacco. She reports that she does not drink alcohol and does not use drugs.     Maternal Diabetes: No Genetic Screening: Normal Maternal Ultrasounds/Referrals: Normal Fetal Ultrasounds or other Referrals:  None Maternal Substance Abuse:   No Significant Maternal Medications:  Meds include: Syntroid Other: cytomel Significant Maternal Lab Results:  Group B Strep negative and Rh negative Other Comments:  None  Review of Systems Maternal Medical History:  Reason for admission: Rupture of membranes.   Contractions: Onset was 13-24 hours ago.   Frequency: irregular.   Perceived severity is mild.   Fetal activity: Perceived fetal activity is normal.   Last perceived fetal movement was within the past hour.   Prenatal complications: no prenatal complications Prenatal Complications - Diabetes: none.    Blood pressure 134/72, pulse 87, temperature 98 F (36.7 C), resp. rate 18, weight 122.3 kg, unknown if currently breastfeeding. Maternal Exam:  Uterine Assessment: Contraction strength is mild.  Abdomen: Patient reports no abdominal tenderness. Surgical scars: low transverse.   Fundal height is S>D.   Estimated fetal weight is 11#.   Fetal presentation: vertex Introitus: Normal vulva. Amniotic fluid character: clear. Pelvis: of concern for delivery.   Cervix: Cervix evaluated by digital exam.   2/20/-3  Fetal Exam Fetal Monitor Review: Baseline rate: 150.  Variability: moderate (6-25 bpm).   Pattern: no accelerations and no decelerations.   Fetal State Assessment: Category I - tracings are normal.  Physical Exam Constitutional:      Appearance: Normal appearance.  HENT:  Head: Normocephalic and atraumatic.  Pulmonary:     Effort: Pulmonary effort is normal.  Abdominal:     Palpations: Abdomen is soft.  Genitourinary:    General: Normal vulva.  Musculoskeletal:        General: Normal range of motion.     Cervical back: Normal range of motion.  Skin:    General: Skin is warm and dry.  Neurological:     Mental Status: She is alert and oriented to person, place, and time.  Psychiatric:        Mood and Affect: Mood normal.        Behavior: Behavior normal.    Prenatal labs: ABO, Rh: O neg Antibody:  Negative Rubella:  Immune RPR:   NR HBsAg:   Negative HIV:   NR GBS:   Negative  Assessment/Plan: 40yo [id number] at [redacted]w[redacted]d with PROM, previous C/S and desires repeat, desires sterility  Patient is counseled re: risk of bleeding, infection, scarring and damage to surrounding structures.  She is informed of risk of regret, failure and ectopic with tubal ligation.  All questions were answered and patient wishes to proceed. I spoke with Dr. Desmond Lope of anesthesia and he would like to wait until 6 pm (po status) for C/S unless labor.  Mitchel Honour 12/31/2020, 2:51 PM

## 2020-12-31 NOTE — Anesthesia Preprocedure Evaluation (Addendum)
Anesthesia Evaluation  Patient identified by MRN, date of birth, ID band Patient awake    Reviewed: Allergy & Precautions, NPO status , Patient's Chart, lab work & pertinent test results  History of Anesthesia Complications Negative for: history of anesthetic complications  Airway Mallampati: II  TM Distance: >3 FB Neck ROM: Full    Dental  (+) Teeth Intact, Dental Advisory Given   Pulmonary asthma , neg recent URI,    Pulmonary exam normal breath sounds clear to auscultation       Cardiovascular Exercise Tolerance: Good negative cardio ROS Normal cardiovascular exam Rhythm:Regular Rate:Normal     Neuro/Psych  Headaches, PSYCHIATRIC DISORDERS Depression    GI/Hepatic negative GI ROS, Neg liver ROS,   Endo/Other  Hypothyroidism Morbid obesity  Renal/GU negative Renal ROS     Musculoskeletal negative musculoskeletal ROS (+)   Abdominal   Peds  Hematology negative hematology ROS (+) Plt 343k   Anesthesia Other Findings Day of surgery medications reviewed with the patient.  Reproductive/Obstetrics History of C-section x1                            Anesthesia Physical Anesthesia Plan  ASA: 3  Anesthesia Plan: Spinal   Post-op Pain Management:    Induction:   PONV Risk Score and Plan: 2 and Treatment may vary due to age or medical condition, Scopolamine patch - Pre-op, Dexamethasone and Ondansetron  Airway Management Planned: Natural Airway  Additional Equipment:   Intra-op Plan:   Post-operative Plan:   Informed Consent: I have reviewed the patients History and Physical, chart, labs and discussed the procedure including the risks, benefits and alternatives for the proposed anesthesia with the patient or authorized representative who has indicated his/her understanding and acceptance.     Dental advisory given  Plan Discussed with: CRNA, Anesthesiologist and  Surgeon  Anesthesia Plan Comments: (Discussed risks and benefits of and differences between spinal and general. Discussed risks of spinal including headache, backache, failure, bleeding, infection, and nerve damage. Patient consents to spinal. Questions answered. Coagulation studies and platelet count acceptable.)        Anesthesia Quick Evaluation

## 2020-12-31 NOTE — Transfer of Care (Signed)
Immediate Anesthesia Transfer of Care Note  Patient: Carolyn Knight  Procedure(s) Performed: CESAREAN SECTION  Patient Location: PACU  Anesthesia Type:Spinal  Level of Consciousness: awake, alert  and oriented  Airway & Oxygen Therapy: Pt on RA, breathing spontaneously   Post-op Assessment: Report given to RN and Post -op Vital signs reviewed and stable  Post vital signs: Reviewed and stable  Last Vitals:  Vitals Value Taken Time  BP 110/69 12/31/20 2015  Temp    Pulse 71 12/31/20 2017  Resp 20 12/31/20 2018  SpO2 100 % 12/31/20 2017  Vitals shown include unvalidated device data.  Last Pain:  Vitals:   12/31/20 1252  PainSc: 5          Complications: No notable events documented.

## 2021-01-01 ENCOUNTER — Encounter (HOSPITAL_COMMUNITY): Payer: Self-pay | Admitting: Obstetrics & Gynecology

## 2021-01-01 LAB — CBC
HCT: 32.6 % — ABNORMAL LOW (ref 36.0–46.0)
Hemoglobin: 10.4 g/dL — ABNORMAL LOW (ref 12.0–15.0)
MCH: 28.7 pg (ref 26.0–34.0)
MCHC: 31.9 g/dL (ref 30.0–36.0)
MCV: 89.8 fL (ref 80.0–100.0)
Platelets: 336 10*3/uL (ref 150–400)
RBC: 3.63 MIL/uL — ABNORMAL LOW (ref 3.87–5.11)
RDW: 15.1 % (ref 11.5–15.5)
WBC: 19.8 10*3/uL — ABNORMAL HIGH (ref 4.0–10.5)
nRBC: 0 % (ref 0.0–0.2)

## 2021-01-01 LAB — RPR: RPR Ser Ql: NONREACTIVE

## 2021-01-01 MED ORDER — RHO D IMMUNE GLOBULIN 1500 UNIT/2ML IJ SOSY
300.0000 ug | PREFILLED_SYRINGE | Freq: Once | INTRAMUSCULAR | Status: AC
  Administered 2021-01-01: 300 ug via INTRAVENOUS
  Filled 2021-01-01: qty 2

## 2021-01-01 NOTE — Social Work (Signed)
CSW received consult for hx of PPD.  CSW met with [name] to offer support and complete assessment.   ° °CSW met with [name] at bedside and introduced CSW role. CSW observed [name] lying in bed and infant sleeping in the bassinet. CSW inquired how [name] has felt since giving birth. [name] reported feeling "great and so much better since having the baby." [name] reported during the pregnancy she felt fine but was ready to deliver. 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 [name] reported she briefly saw a Psychiatrist in the past and recently stop seeing a counselor "[name]" at [facility] a month ago after receiving services for two years. [name] reported she can still reach out to  her counselor as needed. [name] reported she feels comfortable reaching out to her doctor if she has concerns.CSW provided education regarding the baby blues period vs. perinatal mood disorders, discussed treatment and gave resources for mental health. CSW recommended [name] complete a self-evaluation during the postpartum time period using the New Mom Checklist from Postpartum Progress and encouraged [name] to contact a medical professional if symptoms are noted at any time. [name] denied thoughts of harm to self and others. [name] identified spouse as a support.  ° °CSW provided review of Sudden Infant Death Syndrome (SIDS) precautions. [name] reported she has all essential items for the infant including a bassinet where the infant will sleep. [name] has chosen [facility] for infant's follow up care.  CSW assessed [name] for additional needs. [name] reported no further needs.  ° °CSW identifies no further need for intervention and no barriers to discharge at this time.  ° °Carolyn Knight, MSW, LCSW °[facility]  °Clinical Social Worker  °[phone] °01/01/2021  3:31 PM  °

## 2021-01-01 NOTE — Progress Notes (Signed)
POD # 1  Doing well.  BP 129/70 (BP Location: Right Arm)   Pulse 72   Temp 98.2 F (36.8 C) (Oral)   Resp 16   Ht 5' 4.5" (1.638 m)   Wt 122.3 kg   SpO2 98%   Breastfeeding Unknown   BMI 45.56 kg/m  Results for orders placed or performed during the hospital encounter of 12/31/20 (from the past 24 hour(s))  Resp Panel by RT-PCR (Flu A&B, Covid) Nasopharyngeal Swab     Status: None   Collection Time: 12/31/20  1:28 PM   Specimen: Nasopharyngeal Swab; Nasopharyngeal(NP) swabs in vial transport medium  Result Value Ref Range   SARS Coronavirus 2 by RT PCR NEGATIVE NEGATIVE   Influenza A by PCR NEGATIVE NEGATIVE   Influenza B by PCR NEGATIVE NEGATIVE  Type and screen [facility]     Status: None   Collection Time: 12/31/20  2:23 PM  Result Value Ref Range   ABO/RH(D) O NEG    Antibody Screen NEG    Sample Expiration      01/03/2021,2359 Performed at [facility], [address] [place], [place] [address]   CBC     Status: Abnormal   Collection Time: 12/31/20  2:23 PM  Result Value Ref Range   WBC 12.1 (H) 4.0 - 10.5 K/uL   RBC 4.26 3.87 - 5.11 MIL/uL   Hemoglobin 12.0 12.0 - 15.0 g/dL   HCT 29.9 37.1 - 69.6 %   MCV 89.7 80.0 - 100.0 fL   MCH 28.2 26.0 - 34.0 pg   MCHC 31.4 30.0 - 36.0 g/dL   RDW 78.9 38.1 - 01.7 %   Platelets 343 150 - 400 K/uL   nRBC 0.0 0.0 - 0.2 %  Rh IG workup (includes ABO/Rh)     Status: None (Preliminary result)   Collection Time: 01/01/21  4:57 AM  Result Value Ref Range   Gestational Age(Wks) 38.6    Fetal Screen NEG    Unit Number [id number]    Blood Component Type RHIG    Unit division 00    Status of Unit ALLOCATED    Transfusion Status      OK TO TRANSFUSE Performed at [facility], [address] [place], [place] [address]   CBC     Status: Abnormal   Collection Time: 01/01/21  4:57 AM  Result Value Ref Range   WBC 19.8 (H) 4.0 - 10.5 K/uL   RBC 3.63 (L) 3.87 - 5.11 MIL/uL   Hemoglobin 10.4 (L)  12.0 - 15.0 g/dL   HCT 61.4 (L) 43.1 - 54.0 %   MCV 89.8 80.0 - 100.0 fL   MCH 28.7 26.0 - 34.0 pg   MCHC 31.9 30.0 - 36.0 g/dL   RDW 08.6 76.1 - 95.0 %   Platelets 336 150 - 400 K/uL   nRBC 0.0 0.0 - 0.2 %   Abdomen dressing  - some old blood left corner of bandage Uterus is firm  POD # 1  Doing well Routine care Change dressing Circ for baby if cleared by peds Discharge home tomorrow

## 2021-01-01 NOTE — Anesthesia Postprocedure Evaluation (Signed)
Anesthesia Post Note  Patient: Carolyn Knight  Procedure(s) Performed: CESAREAN SECTION     Patient location during evaluation: PACU Anesthesia Type: Spinal Level of consciousness: oriented and awake and alert Pain management: pain level controlled Vital Signs Assessment: post-procedure vital signs reviewed and stable Respiratory status: spontaneous breathing, respiratory function stable and patient connected to nasal cannula oxygen Cardiovascular status: blood pressure returned to baseline and stable Postop Assessment: no headache, no backache and no apparent nausea or vomiting Anesthetic complications: no   No notable events documented.  Last Vitals:  Vitals:   12/31/20 2245 12/31/20 2348  BP: 128/70   Pulse: 70 70  Resp: 18 16  Temp: 36.6 C   SpO2: 98% 99%    Last Pain:  Vitals:   12/31/20 2349  TempSrc:   PainSc: 3    Pain Goal:                Epidural/Spinal Function Cutaneous sensation: Tingles (12/31/20 2349), Patient able to flex knees: Yes (12/31/20 2349), Patient able to lift hips off bed: Yes (12/31/20 2349), Back pain beyond tenderness at insertion site: No (12/31/20 2349), Progressively worsening motor and/or sensory loss: No (12/31/20 2349), Bowel and/or bladder incontinence post epidural: No (12/31/20 2349)  Odyn Turko

## 2021-01-01 NOTE — Lactation Note (Addendum)
This note was copied from a baby's chart. Lactation Consultation Note  Patient Name: Carolyn Knight [id number]'[id number] Date: 01/01/2021   Age:40 hours  LC went in to see mother but RN stated Mom resting until after 5:30 pm. LC to return later in the evening.   LC returned later, Mother states feedings going well. Mom experienced with breastfeeding like to be prn and will call if she needs LC assistance.   Maternal Data    Feeding    LATCH Score                    Lactation Tools Discussed/Used    Interventions    Discharge    Consult Status      Bridgit Eynon  Nicholson-Springer 01/01/2021, 5:12 PM

## 2021-01-02 ENCOUNTER — Inpatient Hospital Stay (HOSPITAL_COMMUNITY)
Admission: RE | Admit: 2021-01-02 | Payer: BC Managed Care – PPO | Source: Home / Self Care | Admitting: Obstetrics and Gynecology

## 2021-01-02 DIAGNOSIS — Z98891 History of uterine scar from previous surgery: Secondary | ICD-10-CM

## 2021-01-02 LAB — RH IG WORKUP (INCLUDES ABO/RH)
Fetal Screen: NEGATIVE
Gestational Age(Wks): 38.6
Unit division: 0

## 2021-01-02 SURGERY — Surgical Case
Anesthesia: Regional | Laterality: Bilateral

## 2021-01-02 MED ORDER — IBUPROFEN 800 MG PO TABS
800.0000 mg | ORAL_TABLET | Freq: Three times a day (TID) | ORAL | 0 refills | Status: AC | PRN
Start: 2021-01-02 — End: ?

## 2021-01-02 MED ORDER — OXYCODONE HCL 5 MG PO TABS: 5.0000 mg | ORAL_TABLET | Freq: Four times a day (QID) | ORAL | 0 refills | Status: AC | PRN

## 2021-01-02 NOTE — Discharge Summary (Signed)
Postpartum Discharge Summary       Patient Name: Carolyn Knight DOB: [birth year] MRN: [id number]  Date of admission: 12/31/2020 Delivery date:12/31/2020  Delivering provider: Linda Hedges  Date of discharge: 01/02/2021  Admitting diagnosis: Previous cesarean section [Z98.891] Intrauterine pregnancy: [redacted]w[redacted]d    Secondary diagnosis:  Principal Problem:   Previous cesarean section  Additional problems:      Discharge diagnosis: Term Pregnancy Delivered                                              Post partum procedures:   Augmentation: N/A Complications: None  Hospital course: Sceduled C/S   40y.o. yo [id number] at [id number] admitted to the hospital 12/31/2020 for scheduled cesarean section with the following indication:Elective Repeat.Delivery details are as follows:  Membrane Rupture Time/Date: 12:52 PM ,12/31/2020   Delivery Method:C-Section, Low Transverse  Details of operation can be found in separate operative note.  Patient had an uncomplicated postpartum course.  She is ambulating, tolerating a regular diet, passing flatus, and urinating well. Patient is discharged home in stable condition on  01/02/21        Newborn Data: Birth date:12/31/2020  Birth time:6:59 PM  Gender:Female  Living status:Living  Apgars:8 ,9  [id number]     Magnesium Sulfate received: No BMZ received: No Rhophylac:Yes MMR:N/A T-DaP:Given prenatally Flu: N/A Transfusion:No  Physical exam  Vitals:   01/01/21 1100 01/01/21 1504 01/01/21 2000 01/02/21 0549  BP: 121/69 120/66 131/72 131/69  Pulse: 61 63 63 74  Resp: '16 17 16 16  ' Temp: 98.4 F (36.9 C) 98.2 F (36.8 C) 98.2 F (36.8 C)   TempSrc: Oral Axillary Oral   SpO2: 98% 98% 98% 100%  Weight:      Height:       General: alert, cooperative, and no distress Lochia: appropriate Uterine Fundus: firm Incision: Healing well with no significant drainage DVT Evaluation: No evidence of DVT seen on physical exam. Labs: Lab Results   Component Value Date   WBC 19.8 (H) 01/01/2021   HGB 10.4 (L) 01/01/2021   HCT 32.6 (L) 01/01/2021   MCV 89.8 01/01/2021   PLT 336 01/01/2021   No flowsheet data found. Edinburgh Score: No flowsheet data found.    After visit meds:  Allergies as of 01/02/2021       Reactions   Gluten Meal Diarrhea, Nausea And Vomiting   Throat irritation and cramping        Medication List     STOP taking these medications    amoxicillin-clavulanate 500-125 MG tablet Commonly known as: AUGMENTIN   aspirin EC 81 MG tablet   calcium carbonate 500 MG chewable tablet Commonly known as: TUMS - dosed in mg elemental calcium   doxylamine (Sleep) 25 MG tablet Commonly known as: UNISOM       TAKE these medications    albuterol 108 (90 Base) MCG/ACT inhaler Commonly known as: VENTOLIN HFA Inhale 2 puffs into the lungs every 6 (six) hours as needed for wheezing or shortness of breath.   ibuprofen 800 MG tablet Commonly known as: ADVIL Take 1 tablet (800 mg total) by mouth every 8 (eight) hours as needed.   levothyroxine 75 MCG tablet Commonly known as: SYNTHROID Take 75 mcg by mouth daily before breakfast.   liothyronine 5 MCG tablet Commonly known as: CYTOMEL Take 5 mcg  by mouth daily.   oxyCODONE 5 MG immediate release tablet Commonly known as: Oxy IR/ROXICODONE Take 1-2 tablets (5-10 mg total) by mouth every 6 (six) hours as needed for moderate pain.   prenatal multivitamin Tabs tablet Take 1 tablet by mouth daily.         Discharge home in stable condition Infant Feeding: Breast Infant Disposition:home with mother Discharge instruction: per After Visit Summary and Postpartum booklet. Activity: Advance as tolerated. Pelvic rest for 6 weeks.  Diet: routine diet Anticipated Birth Control: BTL done PP Postpartum Appointment:6 weeks Additional Postpartum F/U:    Future Appointments:No future appointments. Follow up Visit:      01/02/2021 Luz Lex,  MD

## 2021-01-02 NOTE — Progress Notes (Signed)
Subjective: Postpartum Day 2: Cesarean Delivery Patient reports incisional pain, tolerating PO, + flatus, and no problems voiding.    Objective: Vital signs in last 24 hours: Temp:  [98.2 F (36.8 C)-98.4 F (36.9 C)] 98.2 F (36.8 C) (12/13 2000) Pulse Rate:  [61-74] 74 (12/14 0549) Resp:  [16-17] 16 (12/14 0549) BP: (120-131)/(66-72) 131/69 (12/14 0549) SpO2:  [98 %-100 %] 100 % (12/14 0549)  Physical Exam:  General: alert, cooperative, appears stated age, and mild distress Lochia: appropriate Uterine Fundus: firm Incision: healing well DVT Evaluation: No evidence of DVT seen on physical exam.  Recent Labs    12/31/20 1423 01/01/21 0457  HGB 12.0 10.4*  HCT 38.2 32.6*    Assessment/Plan: Status post Cesarean section. Doing well postoperatively. Circumcision today   Continue current care.  Carolyn Knight 01/02/2021, 10:21 AM

## 2021-01-03 LAB — SURGICAL PATHOLOGY

## 2021-01-15 ENCOUNTER — Telehealth (HOSPITAL_COMMUNITY): Payer: Self-pay | Admitting: *Deleted

## 2021-01-15 NOTE — Telephone Encounter (Signed)
Patient voiced no questions or concerns at this time. EPDS not completed. Patient stated, "I filled that out today for the appointment with the pediatrician. I'm doing ok, a little emotional, but I'm OK." Patient reported that infant "has spit up and choked and stopped breathing several times at night." Patient continued, "The first time it was super scary, so I'm going to ask the pediatrician about it." Patient also stated, "I'm not sleeping because I'm worried about him." Patient verbalized intent to keep pediatrician appointment this morning to have infant evaluated. RN encouraged patient to ask about her EPDS score. Encouraged patient to contact her OB if she does not begin to feel more like her normal self over the next week. Patient verbalized understanding. Patient voiced no other questions or concerns regarding infant at this time. Patient reports infant sleeps in carseat, "because that's the only place we can get him to sleep. RN reviewed ABCs of safe sleep. Patient verbalized understanding. Patient requested RN email information on hospital's virtual postpartum classes and support groups. Patient also requested list of maternal mental health resources. Email sent. Deforest Hoyles, RN, 01/15/21, [phone]

## 2021-06-29 IMAGING — MG MM DIGITAL DIAGNOSTIC UNILAT*L* W/ TOMO W/ CAD
4 series · 4 of 12 positions shown · non-contrast
Comparison: Previous exam(s).

CLINICAL DATA: The patient was called back for a left breast
asymmetry

EXAM:
DIGITAL DIAGNOSTIC LEFT MAMMOGRAM
ULTRASOUND LEFT BREAST

[L MLO synth-2D]
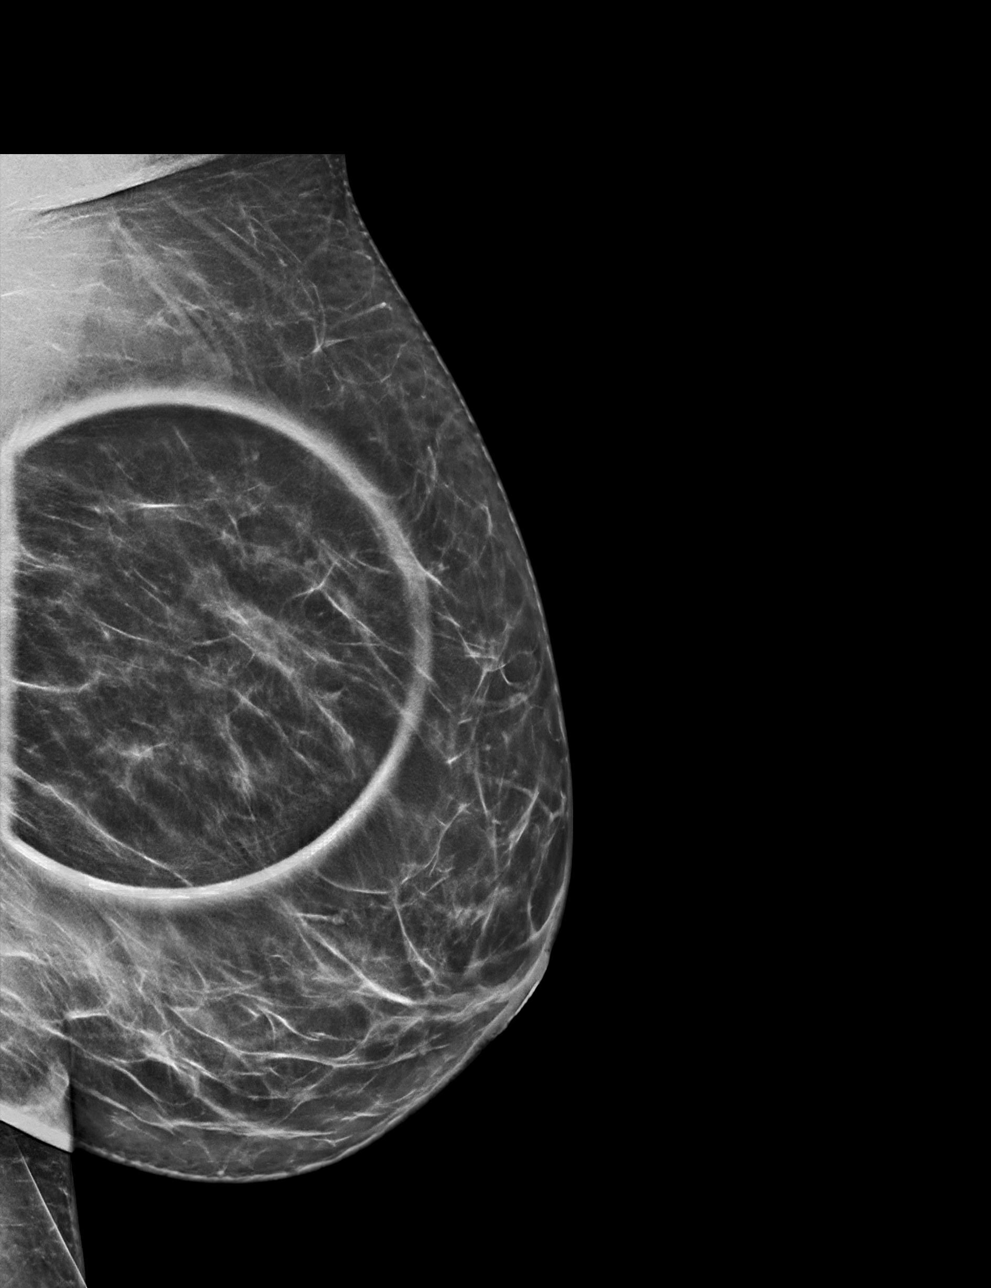

[L ML synth-2D]
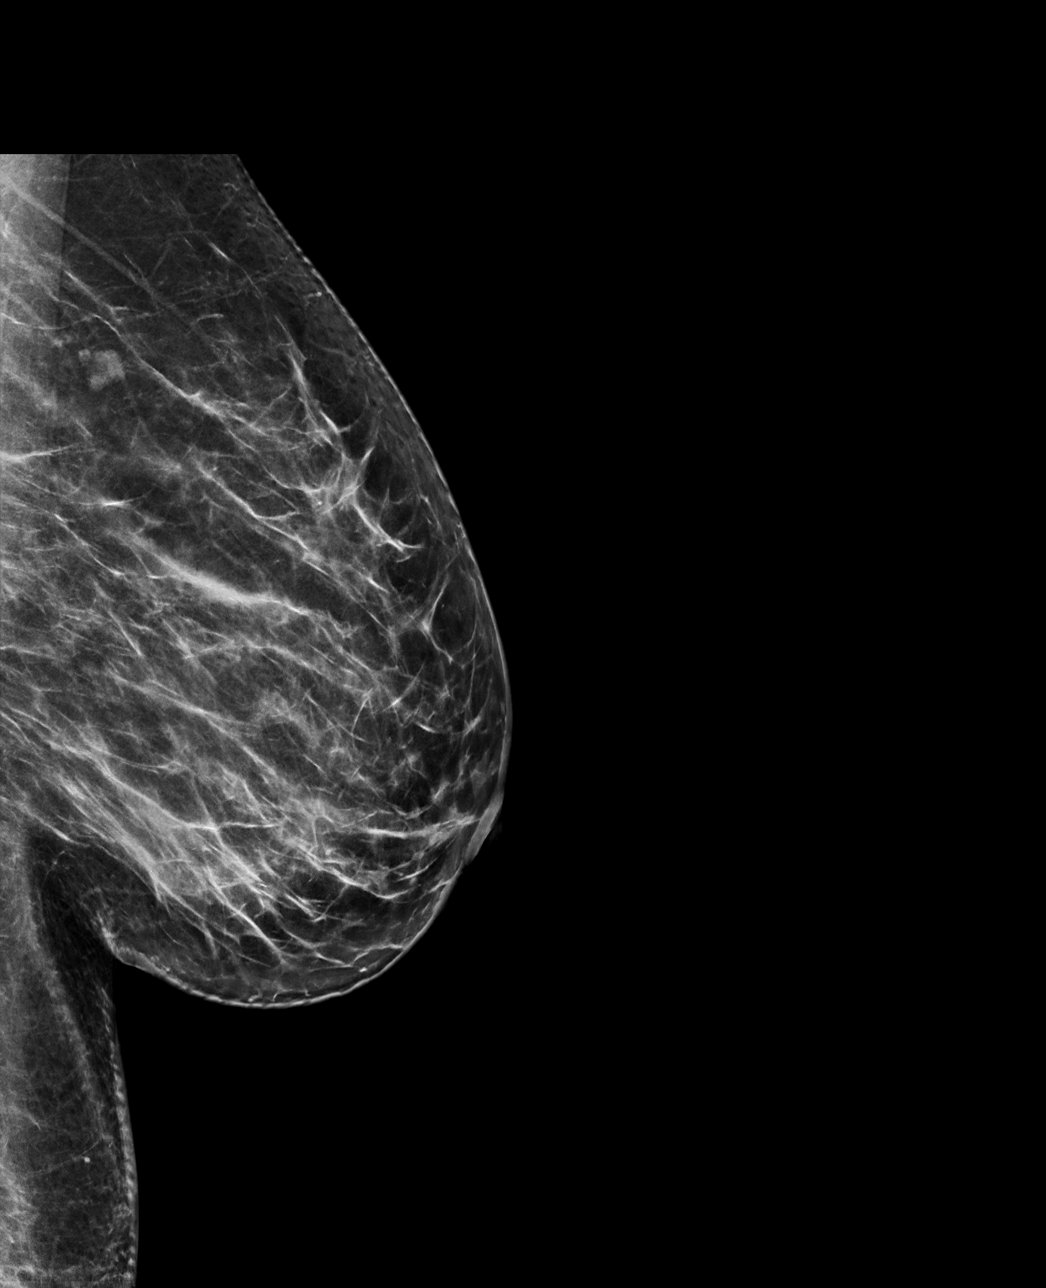

[L MLO tomo · tomo slice 36/71.0]
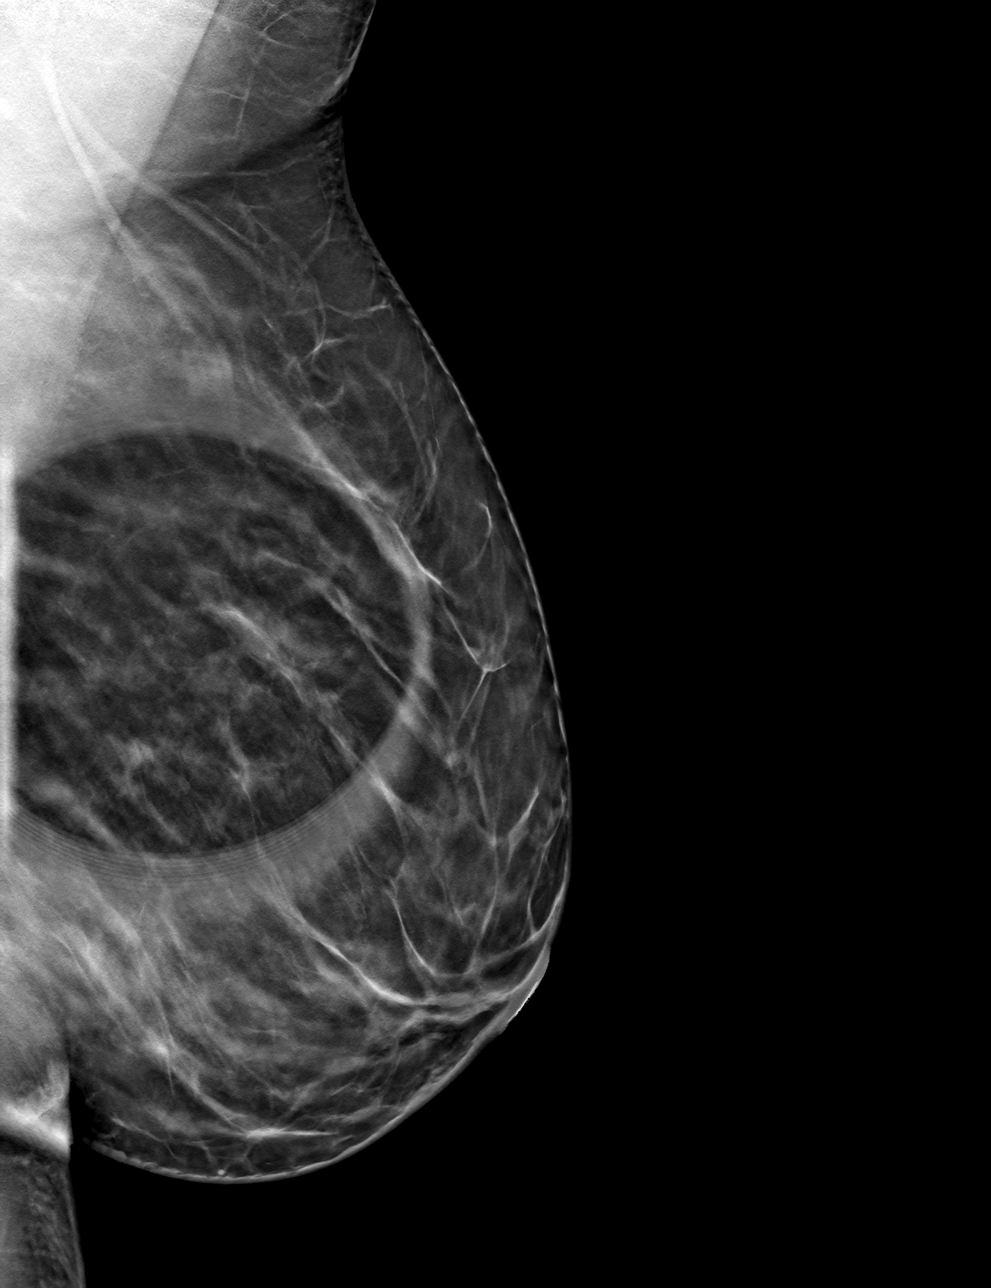

[L ML tomo · tomo slice 39/76.0]
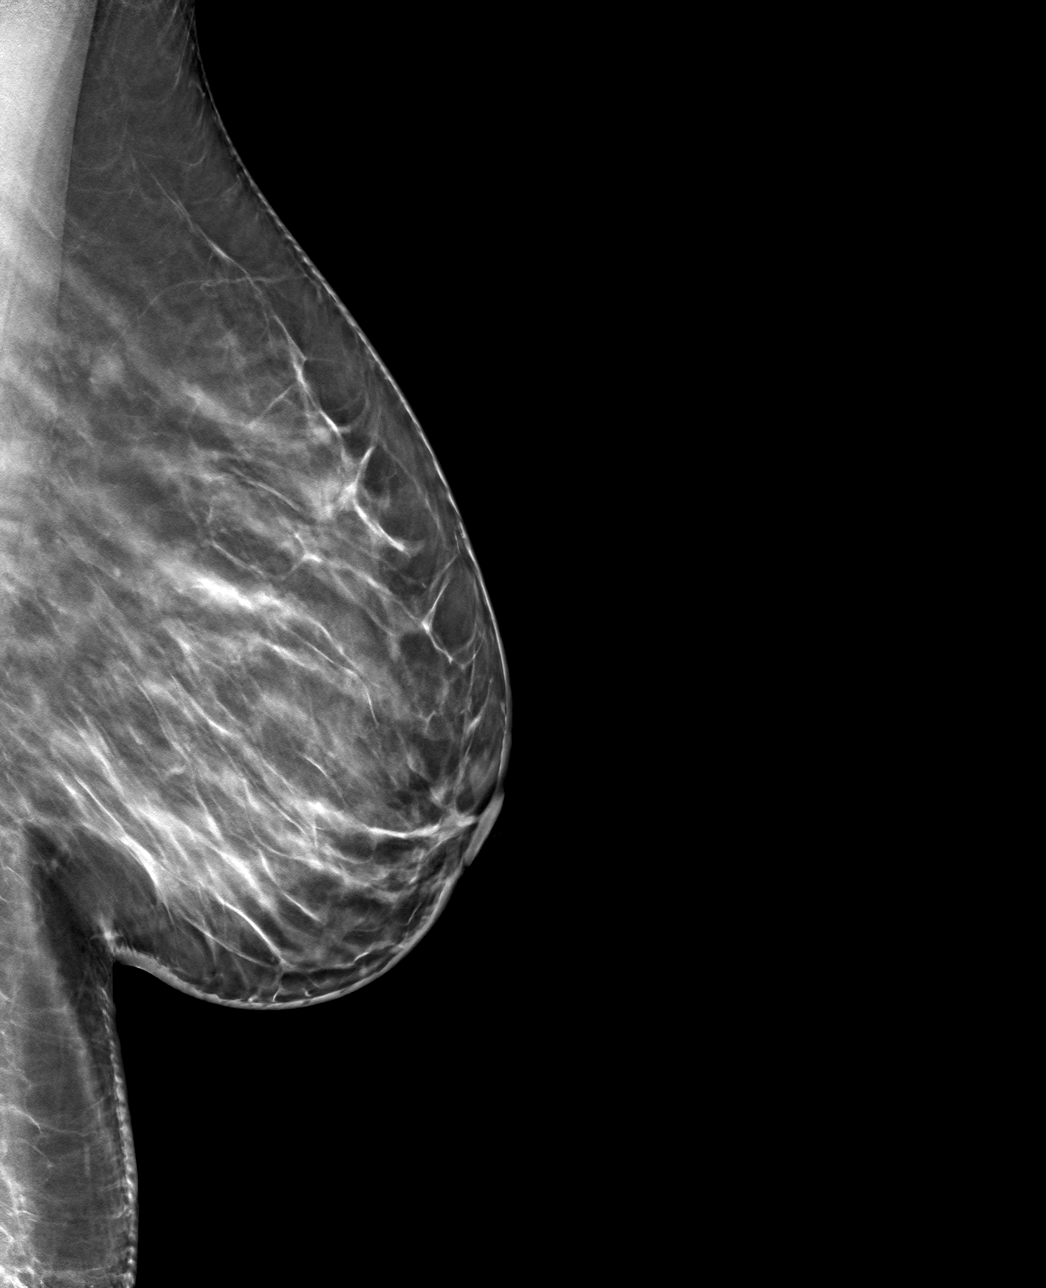

[4 of 12 positions shown; findings below may reference images not displayed]

ACR Breast Density Category b: There are scattered areas of
fibroglandular density.
FINDINGS: The left breast asymmetry improves on today's imaging. The patient
has lost significant weight accounting for the asymmetry and
increased density diffusely in the breast. No evidence of
malignancy.

On physical exam, no suspicious lumps are identified.

Targeted ultrasound is performed, showing no sonographic
abnormalities.
IMPRESSION: No mammographic or sonographic evidence of malignancy. Increasing
density in the breasts consistent with significant interval weight
loss.

RECOMMENDATION:
Annual screening mammography.

I have discussed the findings and recommendations with the patient.
If applicable, a reminder letter will be sent to the patient
regarding the next appointment.

BI-RADS CATEGORY  2: Benign.

## 2021-06-29 IMAGING — US US BREAST*L* LIMITED INC AXILLA
1 series · 6 of 6 positions shown · non-contrast
Comparison: Previous exam(s).

CLINICAL DATA: The patient was called back for a left breast
asymmetry

EXAM:
DIGITAL DIAGNOSTIC LEFT MAMMOGRAM
ULTRASOUND LEFT BREAST

[Series 1: us breast*left* limited inc axilla · 0.07mm/px · 6 of 6 slices shown]
[im 1/6]
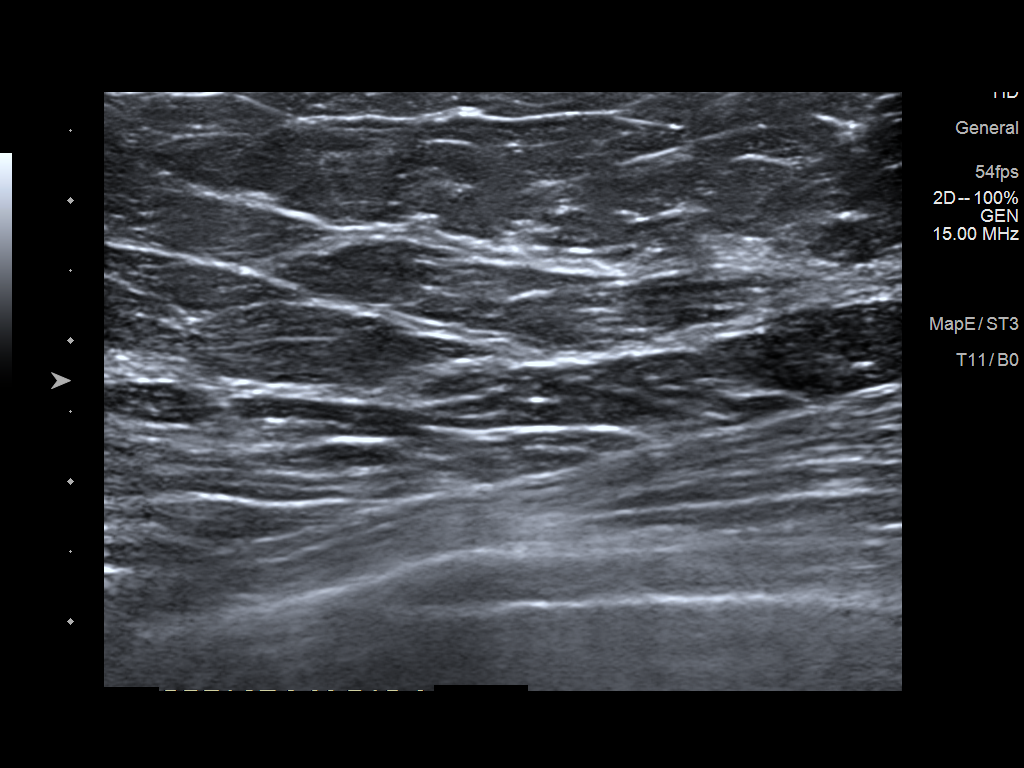
[im 2/6]
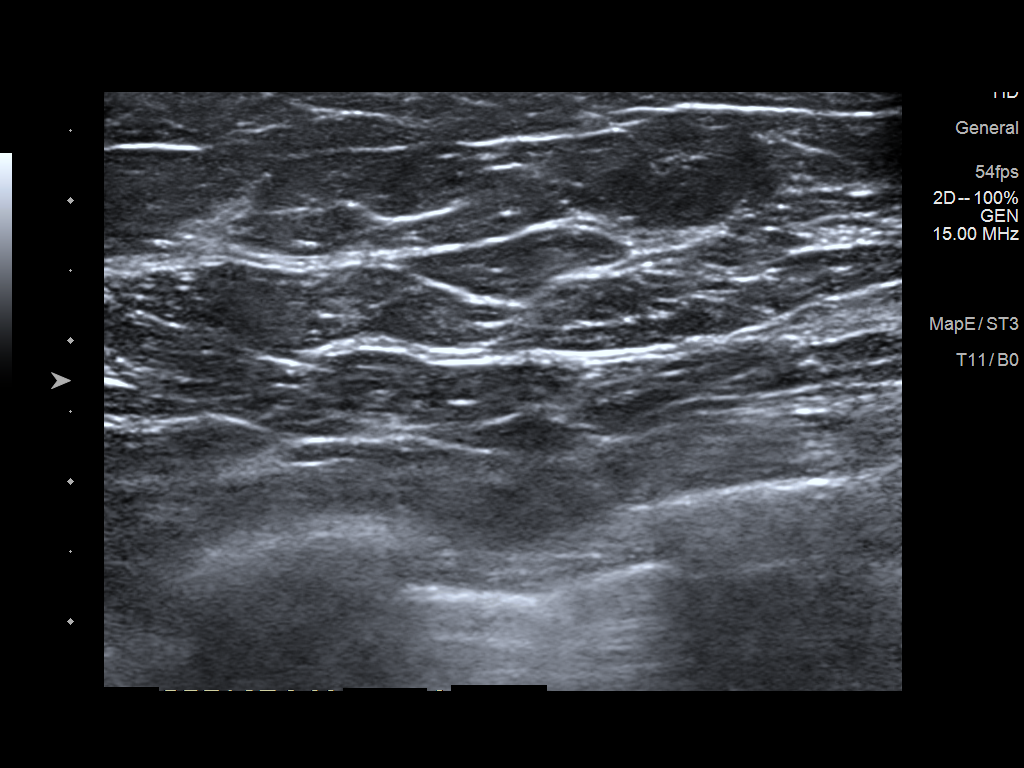
[im 3/6]
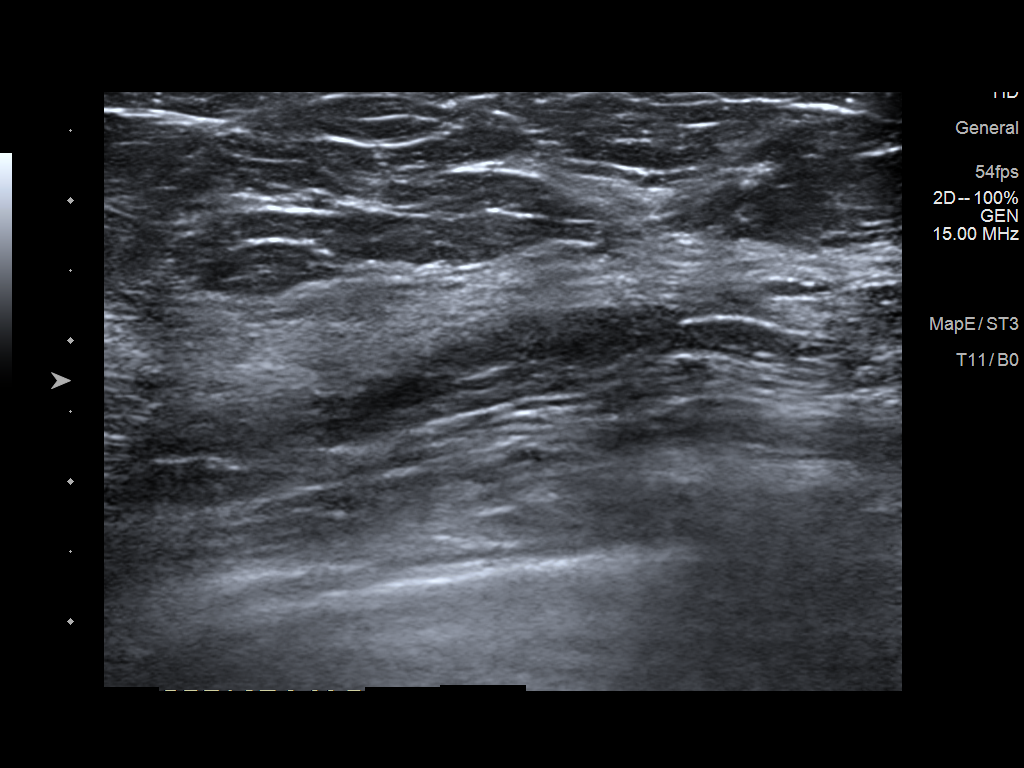
[im 4/6]
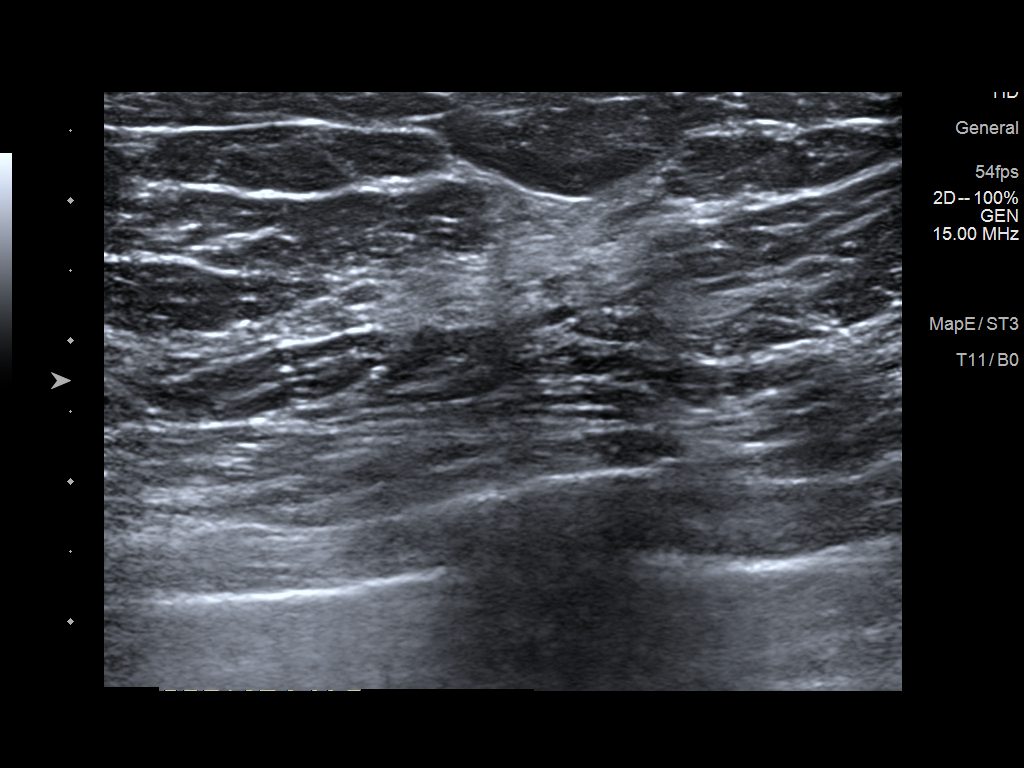
[im 5/6]
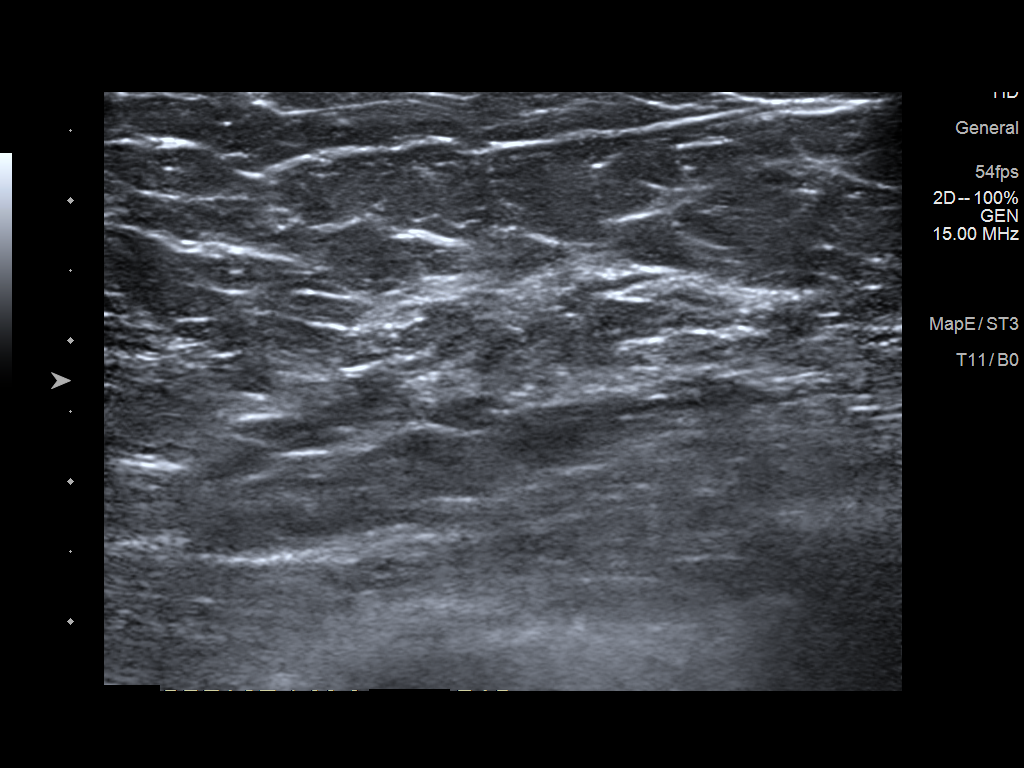
[im 6/6]
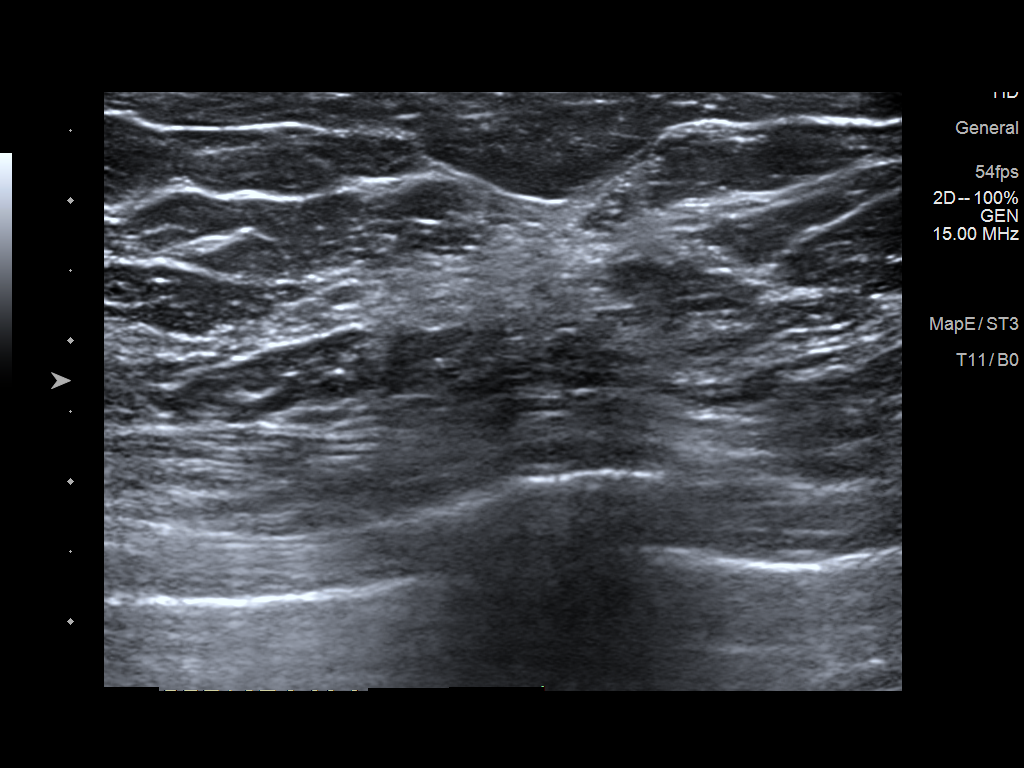

[6 of 6 positions shown; findings below may reference images not displayed]

ACR Breast Density Category b: There are scattered areas of
fibroglandular density.
FINDINGS: The left breast asymmetry improves on today's imaging. The patient
has lost significant weight accounting for the asymmetry and
increased density diffusely in the breast. No evidence of
malignancy.

On physical exam, no suspicious lumps are identified.

Targeted ultrasound is performed, showing no sonographic
abnormalities.
IMPRESSION: No mammographic or sonographic evidence of malignancy. Increasing
density in the breasts consistent with significant interval weight
loss.

RECOMMENDATION:
Annual screening mammography.

I have discussed the findings and recommendations with the patient.
If applicable, a reminder letter will be sent to the patient
regarding the next appointment.

BI-RADS CATEGORY  2: Benign.
